# Patient Record
Sex: Female | Born: 1955
Health system: Southern US, Community
[De-identification: ages and names within clinical notes are randomized; demographics above are authoritative.]

## PROBLEM LIST (undated history)

## (undated) DIAGNOSIS — A6000 Herpesviral infection of urogenital system, unspecified: Secondary | ICD-10-CM

## (undated) HISTORY — DX: Herpesviral infection of urogenital system, unspecified: A60.00

## (undated) HISTORY — PX: BREAST EXCISIONAL BIOPSY: SUR124

## (undated) HISTORY — PX: BREAST SURGERY: SHX581

---

## 2014-07-02 LAB — HM COLONOSCOPY

## 2014-07-09 LAB — HM PAP SMEAR: HM Pap smear: NORMAL

## 2015-12-23 ENCOUNTER — Encounter (HOSPITAL_COMMUNITY): Payer: Self-pay | Admitting: *Deleted

## 2015-12-30 ENCOUNTER — Other Ambulatory Visit (HOSPITAL_COMMUNITY): Payer: Self-pay | Admitting: *Deleted

## 2015-12-30 DIAGNOSIS — N644 Mastodynia: Secondary | ICD-10-CM

## 2016-01-16 ENCOUNTER — Encounter (HOSPITAL_COMMUNITY): Payer: Self-pay | Admitting: *Deleted

## 2016-01-16 ENCOUNTER — Ambulatory Visit
Admission: RE | Admit: 2016-01-16 | Discharge: 2016-01-16 | Disposition: A | Discharge status: Home / Self Care | Payer: No Typology Code available for payment source | Source: Ambulatory Visit | Attending: Obstetrics and Gynecology | Admitting: Obstetrics and Gynecology

## 2016-01-16 ENCOUNTER — Ambulatory Visit (HOSPITAL_COMMUNITY)
Admission: RE | Admit: 2016-01-16 | Discharge: 2016-01-16 | Disposition: A | Discharge status: Home / Self Care | Payer: Self-pay | Source: Ambulatory Visit | Attending: Obstetrics and Gynecology | Admitting: Obstetrics and Gynecology

## 2016-01-16 VITALS — BP 108/64 | Ht 63.0 in | Wt 167.2 lb

## 2016-01-16 DIAGNOSIS — N644 Mastodynia: Secondary | ICD-10-CM

## 2016-01-16 DIAGNOSIS — Z1239 Encounter for other screening for malignant neoplasm of breast: Secondary | ICD-10-CM

## 2016-01-16 NOTE — Progress Notes (Addendum)
Complaints of left outer breast pain that starts at the bottom of the breast radiating up x 2 years. Patient states the pain has been getting worse since April 2016 after her last mammogram. Patient rates the pain at a 7 out of 10.  Pap Smear: Pap smear not completed today. Last Pap smear was in April 2016 at [facility] in [place], [place]. Per patient has no history of an abnormal Pap smear. No Pap smear results are in EPIC.  Physical exam: Breasts Breasts symmetrical. No skin abnormalities left breast. Observed two scars on her right breast upper outer and upper breast on exam due to history of breast surgeries to remove five cysts per patient. No nipple retraction bilateral breasts. No nipple discharge bilateral breasts. No lymphadenopathy. No lumps palpated bilateral breasts. Complaints of bilateral outer breast tenderness on exam that was greater within the left breast. Referred patient to the [facility] for diagnostic mammogram and possible left breast ultrasound. Appointment scheduled for Thursday, January 16, 2016 at 1530.        Pelvic/Bimanual No Pap smear completed today since last Pap smear was in April 2016 per patient. Pap smear not indicated per BCCCP guidelines.   Smoking History: Patient has never smoked.  Patient Navigation: Patient education provided. Access to services provided for patient through [facility] program.   Colorectal Cancer Screening: Per patient had a colonoscopy completed in April 2016. No complaints today.

## 2016-01-16 NOTE — Addendum Note (Signed)
Encounter addended by: Loletta Parish, RN on: 01/16/2016  3:38 PM<BR>    Actions taken: Sign clinical note

## 2016-01-16 NOTE — Patient Instructions (Signed)
Explained self breast awareness to Cassie Haynes. Patient did not need a Pap smear today due to last Pap smear was in April 2016 per patient. Let her know BCCCP will cover Pap smears every 3 years unless has a history of abnormal Pap smears. Referred patient to the [facility] for diagnostic mammogram and possible left breast ultrasound. Appointment scheduled for Thursday, January 16, 2016 at 1530. Cassie Haynes Warrenverbalized understanding.  Dianne Bady, Arvil Chaco, RN 3:35 PM

## 2016-01-20 ENCOUNTER — Encounter (HOSPITAL_COMMUNITY): Payer: Self-pay | Admitting: *Deleted

## 2016-06-04 ENCOUNTER — Other Ambulatory Visit: Payer: Self-pay | Admitting: Obstetrics and Gynecology

## 2016-06-04 DIAGNOSIS — N6320 Unspecified lump in the left breast, unspecified quadrant: Secondary | ICD-10-CM

## 2016-07-17 ENCOUNTER — Ambulatory Visit
Admission: RE | Admit: 2016-07-17 | Discharge: 2016-07-17 | Disposition: A | Discharge status: Home / Self Care | Payer: 59 | Source: Ambulatory Visit | Attending: Obstetrics and Gynecology | Admitting: Obstetrics and Gynecology

## 2016-07-17 DIAGNOSIS — N6320 Unspecified lump in the left breast, unspecified quadrant: Secondary | ICD-10-CM

## 2016-07-23 ENCOUNTER — Ambulatory Visit (INDEPENDENT_AMBULATORY_CARE_PROVIDER_SITE_OTHER): Payer: 59 | Admitting: Primary Care

## 2016-07-23 ENCOUNTER — Encounter: Payer: Self-pay | Admitting: Primary Care

## 2016-07-23 VITALS — BP 122/78 | HR 64 | Temp 98.7°F | Ht 63.5 in | Wt 152.4 lb

## 2016-07-23 DIAGNOSIS — Z0000 Encounter for general adult medical examination without abnormal findings: Secondary | ICD-10-CM | POA: Diagnosis not present

## 2016-07-23 NOTE — Progress Notes (Signed)
Subjective:    Patient ID: Cassie Cassie Haynes, female    DOB: [birth year], 61 y.o.   MRN: [id number]  HPI  Cassie Cassie Haynes is a 61 year old female who presents today to establish care and for complete physical.  She has no complaints today. Moved to [place] from [place] 1.5 years ago.   Immunizations: -Tetanus: Completed over 10 years ago. Declines. -Influenza: Did not receive last season.   Diet: She endorses a healthy diet. She is managed on weight watchers. He has lost 20+ pounds. Breakfast: Eggs, fruit, wheat bread Lunch: Chicken wrap Dinner: Fajitas, ground [place], fish, vegetables Snacks: Applesauce, fruit, salsa, chips Desserts: 4-5 times weekly Beverages: Coffee, water  Exercise: She does not currently exercise, some walking. Eye exam: Completed in 2018 Dental exam: Completed in 2018 Colonoscopy: Completed in 2016, normal. Dexa: Completed several years ago. Pap Smear: Completed in 2016, normal.  Mammogram: Completed 2018   Review of Systems  Constitutional: Negative for unexpected weight change.  HENT: Negative for rhinorrhea.   Respiratory: Negative for cough and shortness of breath.   Cardiovascular: Negative for chest pain.  Gastrointestinal: Negative for constipation and diarrhea.  Genitourinary: Negative for difficulty urinating and menstrual problem.  Musculoskeletal: Negative for arthralgias and myalgias.  Skin: Negative for rash.  Allergic/Immunologic: Negative for environmental allergies.  Neurological: Negative for dizziness, numbness and headaches.  Psychiatric/Behavioral:       She denies concerns for anxiety or depression       No past medical history on file.   Social History   Social History  . Marital status: Married    Spouse name: N/A  . Number of children: N/A  . Years of education: N/A   Occupational History  . Not on file.   Social History Main Topics  . Smoking status: Former Smoker    Quit date: 04/23/1988  . Smokeless tobacco: Never  Used  . Alcohol use No  . Drug use: No  . Sexual activity: Yes   Other Topics Concern  . Not on file   Social History Narrative   Married.   No children.   Works in [employer].   Enjoys traveling.     Past Surgical History:  Procedure Laterality Date  . BREAST EXCISIONAL BIOPSY Right    5 times, cysts removed  . BREAST SURGERY     five surgery    Family History  Problem Relation Age of Onset  . Migraines Mother   . Breast cancer Mother   . Diabetes Brother     Allergies  Allergen Reactions  . Aspirin Other (See Comments)    Upset Stomach  . Bactrim [Sulfamethoxazole-Trimethoprim]     No current outpatient prescriptions on file prior to visit.   No current facility-administered medications on file prior to visit.     BP 122/78   Pulse 64   Temp 98.7 F (37.1 C) (Oral)   Ht 5' 3.5" (1.613 m)   Wt 152 lb 6.4 oz (69.1 kg)   SpO2 99%   BMI 26.57 kg/m    Objective:   Physical Exam  Constitutional: She is oriented to person, place, and time. She appears well-nourished.  HENT:  Right Ear: Tympanic membrane and ear canal normal.  Left Ear: Tympanic membrane and ear canal normal.  Nose: Nose normal.  Mouth/Throat: Oropharynx is clear and moist.  Eyes: Conjunctivae and EOM are normal. Pupils are equal, round, and reactive to light.  Neck: Neck supple. No thyromegaly present.  Cardiovascular: Normal rate and regular  rhythm.   No murmur heard. Pulmonary/Chest: Effort normal and breath sounds normal. She has no rales.  Abdominal: Soft. Bowel sounds are normal. There is no tenderness.  Musculoskeletal: Normal range of motion.  Lymphadenopathy:    She has no cervical adenopathy.  Neurological: She is alert and oriented to person, place, and time. She has normal reflexes. No cranial nerve deficit.  Skin: Skin is warm and dry. No rash noted.  Psychiatric: She has a normal mood and affect.          Assessment & Plan:

## 2016-07-23 NOTE — Patient Instructions (Signed)
[facility] the [facility] to schedule your Bone Density Test.  Schedule a lab only appointment to return fasting for your labs.  Follow up in 1 year for your annual exam or sooner if needed.  It was a pleasure to meet you today! Please don't hesitate to call me with any questions. Welcome to [facility]!

## 2016-07-23 NOTE — Progress Notes (Signed)
Pre visit review using our clinic review tool, if applicable. No additional management support is needed unless otherwise documented below in the visit note. 

## 2016-07-23 NOTE — Assessment & Plan Note (Signed)
Td due, declines today. Mammogram and Pap UTD. Bone density scan due, pending. Colonoscopy UTD. Commended her on her weight loss efforts, encouraged regular exercise. Exam unremarkable.  Labs pending. Follow up in 1 year for annual exam.

## 2016-07-27 ENCOUNTER — Other Ambulatory Visit (INDEPENDENT_AMBULATORY_CARE_PROVIDER_SITE_OTHER): Payer: 59

## 2016-07-27 DIAGNOSIS — Z0000 Encounter for general adult medical examination without abnormal findings: Secondary | ICD-10-CM | POA: Diagnosis not present

## 2016-07-27 LAB — LIPID PANEL
CHOLESTEROL: 208 mg/dL — AB (ref 0–200)
HDL: 58.5 mg/dL (ref >39.00)
LDL Cholesterol: 137 mg/dL — ABNORMAL HIGH (ref 0–99)
NonHDL: 149.02
Total CHOL/HDL Ratio: 4
Triglycerides: 62 mg/dL (ref 0.0–149.0)
VLDL: 12.4 mg/dL (ref 0.0–40.0)

## 2016-07-27 LAB — COMPREHENSIVE METABOLIC PANEL
ALBUMIN: 4.6 g/dL (ref 3.5–5.2)
ALK PHOS: 62 U/L (ref 39–117)
ALT: 12 U/L (ref 0–35)
AST: 18 U/L (ref 0–37)
BUN: 14 mg/dL (ref 6–23)
CALCIUM: 9.7 mg/dL (ref 8.4–10.5)
CO2: 29 mEq/L (ref 19–32)
Chloride: 105 mEq/L (ref 96–112)
Creatinine, Ser: 0.85 mg/dL (ref 0.40–1.20)
GFR: 72.61 mL/min (ref >60.00)
Glucose, Bld: 100 mg/dL — ABNORMAL HIGH (ref 70–99)
Potassium: 4.7 mEq/L (ref 3.5–5.1)
SODIUM: 141 meq/L (ref 135–145)
Total Bilirubin: 0.6 mg/dL (ref 0.2–1.2)
Total Protein: 7.8 g/dL (ref 6.0–8.3)

## 2016-08-12 ENCOUNTER — Telehealth: Payer: Self-pay

## 2016-08-12 DIAGNOSIS — Z87898 Personal history of other specified conditions: Secondary | ICD-10-CM

## 2016-08-12 MED ORDER — SCOPOLAMINE 1 MG/3DAYS TD PT72: MEDICATED_PATCH | TRANSDERMAL | 0 refills | Status: DC

## 2016-08-12 NOTE — Telephone Encounter (Signed)
Noted, scopolamine patches sent to pharmacy. Apply 1 patch to hairless area behind the ear 4-6 hours prior to cruise. Change every 3 days.

## 2016-08-12 NOTE — Telephone Encounter (Signed)
Pt going on 7 day cruise and request sea sick patch to [facility]. Pt established care and annual exam on 07/23/16.Marland KitchenPlease advise.

## 2016-08-13 NOTE — Telephone Encounter (Signed)
Per DPR, left detail message of Kate's comments for patient. 

## 2016-11-06 ENCOUNTER — Other Ambulatory Visit: Payer: Self-pay | Admitting: Family Medicine

## 2016-11-06 DIAGNOSIS — Z1231 Encounter for screening mammogram for malignant neoplasm of breast: Secondary | ICD-10-CM

## 2016-12-14 ENCOUNTER — Other Ambulatory Visit: Payer: Self-pay | Admitting: Primary Care

## 2016-12-14 DIAGNOSIS — E2839 Other primary ovarian failure: Secondary | ICD-10-CM

## 2017-01-18 ENCOUNTER — Ambulatory Visit
Admission: RE | Admit: 2017-01-18 | Discharge: 2017-01-18 | Disposition: A | Discharge status: Home / Self Care | Payer: Managed Care, Other (non HMO) | Source: Ambulatory Visit | Attending: Primary Care | Admitting: Primary Care

## 2017-01-18 ENCOUNTER — Ambulatory Visit: Payer: 59

## 2017-01-18 ENCOUNTER — Ambulatory Visit
Admission: RE | Admit: 2017-01-18 | Discharge: 2017-01-18 | Disposition: A | Discharge status: Home / Self Care | Payer: Managed Care, Other (non HMO) | Source: Ambulatory Visit | Attending: Family Medicine | Admitting: Family Medicine

## 2017-01-18 DIAGNOSIS — Z1231 Encounter for screening mammogram for malignant neoplasm of breast: Secondary | ICD-10-CM

## 2017-01-18 DIAGNOSIS — E2839 Other primary ovarian failure: Secondary | ICD-10-CM

## 2017-05-06 ENCOUNTER — Encounter: Payer: Self-pay | Admitting: Primary Care

## 2017-05-06 ENCOUNTER — Ambulatory Visit (INDEPENDENT_AMBULATORY_CARE_PROVIDER_SITE_OTHER): Payer: Managed Care, Other (non HMO) | Admitting: Primary Care

## 2017-05-06 VITALS — BP 116/82 | HR 70 | Temp 97.5°F | Wt 140.0 lb

## 2017-05-06 DIAGNOSIS — B009 Herpesviral infection, unspecified: Secondary | ICD-10-CM | POA: Diagnosis not present

## 2017-05-06 MED ORDER — VALACYCLOVIR HCL 500 MG PO TABS: ORAL_TABLET | ORAL | 0 refills | Status: DC

## 2017-05-06 NOTE — Progress Notes (Signed)
   Subjective:    Patient ID: Cassie Haynes, female    DOB: 1955-06-21, 62 y.o.   MRN: [id number]  HPI  Cassie Haynes is a 62 year old female who presents today with a chief complaint of genital herpes.  History of genital herpes that was diagnosed 30 years ago. Over the past two months she's experiencing outbreaks every other week. Prior to two months ago she's experienced breakouts every 2-3 months. She's currently not on treatment for outbreaks and has never been treated.  She denies vaginal itching, vaginal discharge, vaginal bleeding, fevers.  Review of Systems  Constitutional: Negative for fever.  Genitourinary: Negative for vaginal discharge.       Genital herpes       No past medical history on file.   Social History   Socioeconomic History  . Marital status: Married    Spouse name: Not on file  . Number of children: Not on file  . Years of education: Not on file  . Highest education level: Not on file  Social Needs  . Financial resource strain: Not on file  . Food insecurity - worry: Not on file  . Food insecurity - inability: Not on file  . Transportation needs - medical: Not on file  . Transportation needs - non-medical: Not on file  Occupational History  . Not on file  Tobacco Use  . Smoking status: Former Smoker    Last attempt to quit: 04/23/1988    Years since quitting: 29.0  . Smokeless tobacco: Never Used  Substance and Sexual Activity  . Alcohol use: No  . Drug use: No  . Sexual activity: Yes  Other Topics Concern  . Not on file  Social History Narrative   Married.   No children.   Works in [employer].   Enjoys traveling.     Past Surgical History:  Procedure Laterality Date  . BREAST EXCISIONAL BIOPSY Right    5 times, cysts removed  . BREAST SURGERY     five surgery    Family History  Problem Relation Age of Onset  . Migraines Mother   . Breast cancer Mother   . Diabetes Brother     Allergies  Allergen Reactions  . Aspirin  Other (See Comments)    Upset Stomach  . Bactrim [Sulfamethoxazole-Trimethoprim]     No current outpatient medications on file prior to visit.   No current facility-administered medications on file prior to visit.     BP 116/82 (BP Location: Left Arm, Patient Position: Sitting, Cuff Size: Normal)   Pulse 70   Temp (!) 97.5 F (36.4 C) (Oral)   Wt 140 lb (63.5 kg)   SpO2 96%   BMI 24.41 kg/m    Objective:   Physical Exam  Constitutional: She appears well-nourished.  Neck: Neck supple.  Cardiovascular: Normal rate and regular rhythm.  Pulmonary/Chest: Effort normal and breath sounds normal.  Skin: Skin is warm and dry.          Assessment & Plan:

## 2017-05-06 NOTE — Patient Instructions (Signed)
Start valacyclovir 500 mg tablets. Take 1 tablet twice daily for three days, then 1 tablet once daily thereafter.  Please update me in 2 months.  It was a pleasure to see you today!

## 2017-05-06 NOTE — Assessment & Plan Note (Signed)
Genital herpes diagnosed 30 years ago. Recurrent breakouts over the past two months, also seems like recurrent breakouts in general.  Treat acute episode with one tablet BID x 3 days, then start daily treatment x 2 months. She'll update in 2 months.

## 2017-06-05 ENCOUNTER — Encounter: Payer: Self-pay | Admitting: Primary Care

## 2017-06-05 DIAGNOSIS — B009 Herpesviral infection, unspecified: Secondary | ICD-10-CM

## 2017-06-08 MED ORDER — VALACYCLOVIR HCL 1 G PO TABS
1000.0000 mg | ORAL_TABLET | Freq: Every day | ORAL | 0 refills | Status: DC
Start: 2017-06-08 — End: 2017-08-10

## 2017-06-08 MED ORDER — VALACYCLOVIR HCL 1 G PO TABS: 1000.0000 mg | ORAL_TABLET | Freq: Every day | ORAL | 0 refills | Status: DC

## 2017-08-10 ENCOUNTER — Encounter: Payer: Self-pay | Admitting: Primary Care

## 2017-08-10 ENCOUNTER — Ambulatory Visit (INDEPENDENT_AMBULATORY_CARE_PROVIDER_SITE_OTHER): Payer: Managed Care, Other (non HMO) | Admitting: Primary Care

## 2017-08-10 ENCOUNTER — Other Ambulatory Visit (HOSPITAL_COMMUNITY)
Admission: RE | Admit: 2017-08-10 | Discharge: 2017-08-10 | Disposition: A | Discharge status: Home / Self Care | Payer: Managed Care, Other (non HMO) | Source: Ambulatory Visit | Attending: Primary Care | Admitting: Primary Care

## 2017-08-10 ENCOUNTER — Encounter: Payer: 59 | Admitting: Family Medicine

## 2017-08-10 VITALS — BP 116/70 | HR 72 | Temp 98.0°F | Ht 63.5 in | Wt 137.8 lb

## 2017-08-10 DIAGNOSIS — Z124 Encounter for screening for malignant neoplasm of cervix: Secondary | ICD-10-CM | POA: Insufficient documentation

## 2017-08-10 DIAGNOSIS — Z0000 Encounter for general adult medical examination without abnormal findings: Secondary | ICD-10-CM | POA: Diagnosis not present

## 2017-08-10 DIAGNOSIS — Z1159 Encounter for screening for other viral diseases: Secondary | ICD-10-CM

## 2017-08-10 DIAGNOSIS — B009 Herpesviral infection, unspecified: Secondary | ICD-10-CM

## 2017-08-10 DIAGNOSIS — Z23 Encounter for immunization: Secondary | ICD-10-CM

## 2017-08-10 LAB — COMPREHENSIVE METABOLIC PANEL
ALBUMIN: 4.4 g/dL (ref 3.5–5.2)
ALT: 9 U/L (ref 0–35)
AST: 15 U/L (ref 0–37)
Alkaline Phosphatase: 58 U/L (ref 39–117)
BUN: 18 mg/dL (ref 6–23)
CHLORIDE: 103 meq/L (ref 96–112)
CO2: 30 mEq/L (ref 19–32)
Calcium: 9.5 mg/dL (ref 8.4–10.5)
Creatinine, Ser: 0.79 mg/dL (ref 0.40–1.20)
GFR: 78.73 mL/min (ref >60.00)
Glucose, Bld: 99 mg/dL (ref 70–99)
POTASSIUM: 4.5 meq/L (ref 3.5–5.1)
SODIUM: 140 meq/L (ref 135–145)
Total Bilirubin: 0.5 mg/dL (ref 0.2–1.2)
Total Protein: 7.6 g/dL (ref 6.0–8.3)

## 2017-08-10 LAB — LIPID PANEL
CHOLESTEROL: 214 mg/dL — AB (ref 0–200)
HDL: 65.7 mg/dL (ref >39.00)
LDL CALC: 134 mg/dL — AB (ref 0–99)
NonHDL: 147.94
Total CHOL/HDL Ratio: 3
Triglycerides: 69 mg/dL (ref 0.0–149.0)
VLDL: 13.8 mg/dL (ref 0.0–40.0)

## 2017-08-10 MED ORDER — VALACYCLOVIR HCL 1 G PO TABS: 1000.0000 mg | ORAL_TABLET | Freq: Every day | ORAL | 0 refills | Status: DC

## 2017-08-10 NOTE — Progress Notes (Signed)
Subjective:    Patient ID: Cassie Haynes, female    DOB: Aug 31, 1955, 62 y.o.   MRN: [id number]  HPI  Cassie Haynes is a 62 year old female who presents today for complete physical. She is also due for her Pap smear.  Immunizations: -Tetanus: Due -Influenza: Completed last season   -Shingles: Declines  Diet: She endorses a healthy diet. Breakfast: Egg, fruit, oatmeal, cereal Lunch: Chicken wrap, pickles Dinner: Fish, [place] burger, vegetables Snacks: Fruit, applesauce, crackers with salsa Desserts: Nightly (ice cream, fruit, whipped cream) Beverages: Water, coffee  Exercise: She is walking daily for 30-60 minutes Eye exam: Completed in April 2019 Dental exam: Completes semi-annually Dexa: Completed in 2018, osteopenia. Compliant to calcium and vitamin D. Colonoscopy: Completed in 2016, due in 2026 Pap Smear: Completed in 2016, due today Mammogram: Completed in October 2018 Hep C Screen: Due today   Review of Systems  Constitutional: Negative for unexpected weight change.  HENT: Negative for rhinorrhea.   Respiratory: Negative for cough and shortness of breath.   Cardiovascular: Negative for chest pain.  Gastrointestinal: Negative for constipation and diarrhea.  Genitourinary: Negative for difficulty urinating and menstrual problem.  Musculoskeletal: Negative for arthralgias and myalgias.  Skin: Negative for rash.  Allergic/Immunologic: Negative for environmental allergies.  Neurological: Negative for dizziness, numbness and headaches.  Psychiatric/Behavioral: The patient is not nervous/anxious.        No past medical history on file.   Social History   Socioeconomic History  . Marital status: Married    Spouse name: Not on file  . Number of children: Not on file  . Years of education: Not on file  . Highest education level: Not on file  Occupational History  . Not on file  Social Needs  . Financial resource strain: Not on file  . Food insecurity:    Worry:  Not on file    Inability: Not on file  . Transportation needs:    Medical: Not on file    Non-medical: Not on file  Tobacco Use  . Smoking status: Former Smoker    Last attempt to quit: 04/23/1988    Years since quitting: 29.3  . Smokeless tobacco: Never Used  Substance and Sexual Activity  . Alcohol use: No  . Drug use: No  . Sexual activity: Yes  Lifestyle  . Physical activity:    Days per week: Not on file    Minutes per session: Not on file  . Stress: Not on file  Relationships  . Social connections:    Talks on phone: Not on file    Gets together: Not on file    Attends religious service: Not on file    Active member of club or organization: Not on file    Attends meetings of clubs or organizations: Not on file    Relationship status: Not on file  . Intimate partner violence:    Fear of current or ex partner: Not on file    Emotionally abused: Not on file    Physically abused: Not on file    Forced sexual activity: Not on file  Other Topics Concern  . Not on file  Social History Narrative   Married.   No children.   Works in [employer].   Enjoys traveling.     Past Surgical History:  Procedure Laterality Date  . BREAST EXCISIONAL BIOPSY Right    5 times, cysts removed  . BREAST SURGERY     five surgery    Family History  Problem Relation Age of Onset  . Migraines Mother   . Breast cancer Mother   . Diabetes Brother     Allergies  Allergen Reactions  . Aspirin Other (See Comments)    Upset Stomach  . Bactrim [Sulfamethoxazole-Trimethoprim]     No current outpatient medications on file prior to visit.   No current facility-administered medications on file prior to visit.     BP 116/70   Pulse 72   Temp 98 F (36.7 C) (Oral)   Ht 5' 3.5" (1.613 m)   Wt 137 lb 12 oz (62.5 kg)   BMI 24.02 kg/m    Objective:   Physical Exam  Constitutional: She is oriented to person, place, and time. She appears well-nourished.  HENT:  Right Ear:  Tympanic membrane and ear canal normal.  Left Ear: Tympanic membrane and ear canal normal.  Nose: Nose normal.  Mouth/Throat: Oropharynx is clear and moist.  Eyes: Pupils are equal, round, and reactive to light. Conjunctivae and EOM are normal.  Neck: Neck supple. No thyromegaly present.  Cardiovascular: Normal rate and regular rhythm.  No murmur heard. Pulmonary/Chest: Effort normal and breath sounds normal. She has no rales.  Abdominal: Soft. Bowel sounds are normal. There is no tenderness.  Genitourinary: Vagina normal and uterus normal. There is no tenderness or lesion on the right labia. There is no tenderness or lesion on the left labia. Cervix exhibits no motion tenderness and no discharge. Right adnexum displays no mass and no tenderness. Left adnexum displays no mass and no tenderness. No erythema in the vagina. No vaginal discharge found.  Musculoskeletal: Normal range of motion.  Lymphadenopathy:    She has no cervical adenopathy.  Neurological: She is alert and oriented to person, place, and time. She has normal reflexes. No cranial nerve deficit.  Skin: Skin is warm and dry. No rash noted.  Psychiatric: She has a normal mood and affect.          Assessment & Plan:

## 2017-08-10 NOTE — Addendum Note (Signed)
Addended by: Jacqualin Combes on: 08/10/2017 11:22 AM   Modules accepted: Orders

## 2017-08-10 NOTE — Patient Instructions (Signed)
Stop by the lab prior to leaving today. I will notify you of your results once received.   Continue Valtrex daily as discussed, update me in 3 months via My Chart.  We will be in touch once we receive your pap smear results.  Continue exercising. You should be getting 150 minutes of moderate intensity exercise weekly.  Ensure you are consuming 64 ounces of water daily.  You were provided with a tetanus vaccination which will cover you for 10 years.  Follow up in 1 year for your annual exam or sooner if needed.  It was a pleasure to see you today!   Preventive Care 40-64 Years, Female Preventive care refers to lifestyle choices and visits with your health care provider that can promote health and wellness. What does preventive care include?  A yearly physical exam. This is also called an annual well check.  Dental exams once or twice a year.  Routine eye exams. Ask your health care provider how often you should have your eyes checked.  Personal lifestyle choices, including: ? Daily care of your teeth and gums. ? Regular physical activity. ? Eating a healthy diet. ? Avoiding tobacco and drug use. ? Limiting alcohol use. ? Practicing safe sex. ? Taking low-dose aspirin daily starting at age 61. ? Taking vitamin and mineral supplements as recommended by your health care provider. What happens during an annual well check? The services and screenings done by your health care provider during your annual well check will depend on your age, overall health, lifestyle risk factors, and family history of disease. Counseling Your health care provider may ask you questions about your:  Alcohol use.  Tobacco use.  Drug use.  Emotional well-being.  Home and relationship well-being.  Sexual activity.  Eating habits.  Work and work Statistician.  Method of birth control.  Menstrual cycle.  Pregnancy history.  Screening You may have the following tests or  measurements:  Height, weight, and BMI.  Blood pressure.  Lipid and cholesterol levels. These may be checked every 5 years, or more frequently if you are over 72 years old.  Skin check.  Lung cancer screening. You may have this screening every year starting at age 28 if you have a 30-pack-year history of smoking and currently smoke or have quit within the past 15 years.  Fecal occult blood test (FOBT) of the stool. You may have this test every year starting at age 87.  Flexible sigmoidoscopy or colonoscopy. You may have a sigmoidoscopy every 5 years or a colonoscopy every 10 years starting at age 68.  Hepatitis C blood test.  Hepatitis B blood test.  Sexually transmitted disease (STD) testing.  Diabetes screening. This is done by checking your blood sugar (glucose) after you have not eaten for a while (fasting). You may have this done every 1-3 years.  Mammogram. This may be done every 1-2 years. Talk to your health care provider about when you should start having regular mammograms. This may depend on whether you have a family history of breast cancer.  BRCA-related cancer screening. This may be done if you have a family history of breast, ovarian, tubal, or peritoneal cancers.  Pelvic exam and Pap test. This may be done every 3 years starting at age 78. Starting at age 45, this may be done every 5 years if you have a Pap test in combination with an HPV test.  Bone density scan. This is done to screen for osteoporosis. You may have this scan  if you are at high risk for osteoporosis.  Discuss your test results, treatment options, and if necessary, the need for more tests with your health care provider. Vaccines Your health care provider may recommend certain vaccines, such as:  Influenza vaccine. This is recommended every year.  Tetanus, diphtheria, and acellular pertussis (Tdap, Td) vaccine. You may need a Td booster every 10 years.  Varicella vaccine. You may need this if  you have not been vaccinated.  Zoster vaccine. You may need this after age 93.  Measles, mumps, and rubella (MMR) vaccine. You may need at least one dose of MMR if you were born in 1957 or later. You may also need a second dose.  Pneumococcal 13-valent conjugate (PCV13) vaccine. You may need this if you have certain conditions and were not previously vaccinated.  Pneumococcal polysaccharide (PPSV23) vaccine. You may need one or two doses if you smoke cigarettes or if you have certain conditions.  Meningococcal vaccine. You may need this if you have certain conditions.  Hepatitis A vaccine. You may need this if you have certain conditions or if you travel or work in places where you may be exposed to hepatitis A.  Hepatitis B vaccine. You may need this if you have certain conditions or if you travel or work in places where you may be exposed to hepatitis B.  Haemophilus influenzae type b (Hib) vaccine. You may need this if you have certain conditions.  Talk to your health care provider about which screenings and vaccines you need and how often you need them. This information is not intended to replace advice given to you by your health care provider. Make sure you discuss any questions you have with your health care provider. Document Released: 04/05/2015 Document Revised: 11/27/2015 Document Reviewed: 01/08/2015 Elsevier Interactive Patient Education  [employer].

## 2017-08-10 NOTE — Assessment & Plan Note (Signed)
Continues to experience irritation with daily medication, no outbreaks. Continue daily Valtrex. She will update in 3 months. Refill sent to pharmacy.

## 2017-08-11 LAB — HEPATITIS C ANTIBODY
HEP C AB: NONREACTIVE
SIGNAL TO CUT-OFF: 0.03 (ref <1.00)

## 2017-08-12 ENCOUNTER — Other Ambulatory Visit: Payer: Self-pay | Admitting: Primary Care

## 2017-08-12 DIAGNOSIS — Z1231 Encounter for screening mammogram for malignant neoplasm of breast: Secondary | ICD-10-CM

## 2017-08-12 LAB — CYTOLOGY - PAP: HPV (WINDOPATH): NOT DETECTED

## 2017-09-17 ENCOUNTER — Encounter: Payer: Self-pay | Admitting: Primary Care

## 2017-09-20 MED ORDER — HYDROCORTISONE 2.5 % RE CREA: 1.0000 "application " | TOPICAL_CREAM | Freq: Two times a day (BID) | RECTAL | 0 refills | Status: DC

## 2017-11-13 DIAGNOSIS — B009 Herpesviral infection, unspecified: Secondary | ICD-10-CM

## 2017-11-16 MED ORDER — VALACYCLOVIR HCL 1 G PO TABS: 1000.0000 mg | ORAL_TABLET | Freq: Every day | ORAL | 1 refills | Status: DC

## 2017-11-17 ENCOUNTER — Encounter: Payer: Self-pay | Admitting: Primary Care

## 2018-01-20 ENCOUNTER — Ambulatory Visit: Payer: Managed Care, Other (non HMO)

## 2018-01-20 ENCOUNTER — Ambulatory Visit
Admission: RE | Admit: 2018-01-20 | Discharge: 2018-01-20 | Disposition: A | Discharge status: Home / Self Care | Payer: Managed Care, Other (non HMO) | Source: Ambulatory Visit | Attending: Primary Care | Admitting: Primary Care

## 2018-01-20 DIAGNOSIS — Z1231 Encounter for screening mammogram for malignant neoplasm of breast: Secondary | ICD-10-CM

## 2018-05-26 DIAGNOSIS — B009 Herpesviral infection, unspecified: Secondary | ICD-10-CM

## 2018-05-26 MED ORDER — VALACYCLOVIR HCL 1 G PO TABS: 1000.0000 mg | ORAL_TABLET | Freq: Every day | ORAL | 1 refills | Status: DC

## 2018-08-16 ENCOUNTER — Encounter: Payer: Managed Care, Other (non HMO) | Admitting: Primary Care

## 2018-09-28 ENCOUNTER — Other Ambulatory Visit (HOSPITAL_COMMUNITY)
Admission: RE | Admit: 2018-09-28 | Discharge: 2018-09-28 | Disposition: A | Discharge status: Home / Self Care | Payer: Managed Care, Other (non HMO) | Source: Ambulatory Visit | Attending: Primary Care | Admitting: Primary Care

## 2018-09-28 ENCOUNTER — Encounter: Payer: Self-pay | Admitting: Primary Care

## 2018-09-28 ENCOUNTER — Telehealth: Payer: Self-pay | Admitting: Primary Care

## 2018-09-28 ENCOUNTER — Other Ambulatory Visit: Payer: Self-pay

## 2018-09-28 ENCOUNTER — Ambulatory Visit (INDEPENDENT_AMBULATORY_CARE_PROVIDER_SITE_OTHER): Payer: Managed Care, Other (non HMO) | Admitting: Primary Care

## 2018-09-28 VITALS — BP 120/78 | HR 64 | Temp 98.0°F | Ht 63.5 in | Wt 127.5 lb

## 2018-09-28 DIAGNOSIS — Z124 Encounter for screening for malignant neoplasm of cervix: Secondary | ICD-10-CM

## 2018-09-28 DIAGNOSIS — B009 Herpesviral infection, unspecified: Secondary | ICD-10-CM

## 2018-09-28 DIAGNOSIS — Z0000 Encounter for general adult medical examination without abnormal findings: Secondary | ICD-10-CM | POA: Diagnosis not present

## 2018-09-28 LAB — COMPREHENSIVE METABOLIC PANEL
ALT: 12 U/L (ref 0–35)
AST: 19 U/L (ref 0–37)
Albumin: 4.5 g/dL (ref 3.5–5.2)
Alkaline Phosphatase: 57 U/L (ref 39–117)
BUN: 17 mg/dL (ref 6–23)
CO2: 31 mEq/L (ref 19–32)
Calcium: 9.2 mg/dL (ref 8.4–10.5)
Chloride: 102 mEq/L (ref 96–112)
Creatinine, Ser: 0.84 mg/dL (ref 0.40–1.20)
GFR: 68.75 mL/min (ref >60.00)
Glucose, Bld: 87 mg/dL (ref 70–99)
Potassium: 4.2 mEq/L (ref 3.5–5.1)
Sodium: 141 mEq/L (ref 135–145)
Total Bilirubin: 0.7 mg/dL (ref 0.2–1.2)
Total Protein: 7.4 g/dL (ref 6.0–8.3)

## 2018-09-28 LAB — LIPID PANEL
Cholesterol: 203 mg/dL — ABNORMAL HIGH (ref 0–200)
HDL: 59.3 mg/dL (ref >39.00)
LDL Cholesterol: 129 mg/dL — ABNORMAL HIGH (ref 0–99)
NonHDL: 143.72
Total CHOL/HDL Ratio: 3
Triglycerides: 75 mg/dL (ref 0.0–149.0)
VLDL: 15 mg/dL (ref 0.0–40.0)

## 2018-09-28 MED ORDER — VALACYCLOVIR HCL 1 G PO TABS: 500.0000 mg | ORAL_TABLET | Freq: Every day | ORAL | 0 refills | Status: DC

## 2018-09-28 NOTE — Telephone Encounter (Signed)
Noted. Will administer at upcoming visit. FYI to [facility].

## 2018-09-28 NOTE — Assessment & Plan Note (Signed)
Tetanus UTD. Offered Shingrix for which she will consider after she speaks with insurance regarding coverage.  Pap smear UTD but she prefers them done annually. Pending. Mammogram UTD. Colonoscopy UTD. Commended her on a healthy diet and exercise. Exam today unremarkable. Labs pending. Follow up in 1 year for CPE.

## 2018-09-28 NOTE — Patient Instructions (Signed)
Stop by the lab prior to leaving today. I will notify you of your results once received.   Try stopping the valacyclovir to see if you have any herpes outbreaks. If you start to notice outbreaks then try 1/2 tablet of the valacyclovir.   Please update me regardless.  Continue exercising. You should be getting 150 minutes of moderate intensity exercise weekly.  Continue to work on a healthy diet.  Notify me if you are interested in the Shingles vaccination.  It was a pleasure to see you today!   Preventive Care 40-64 Years Old, Female Preventive care refers to visits with your health care provider and lifestyle choices that can promote health and wellness. This includes:  A yearly physical exam. This may also be called an annual well check.  Regular dental visits and eye exams.  Immunizations.  Screening for certain conditions.  Healthy lifestyle choices, such as eating a healthy diet, getting regular exercise, not using drugs or products that contain nicotine and tobacco, and limiting alcohol use. What can I expect for my preventive care visit? Physical exam Your health care provider will check your:  Height and weight. This may be used to calculate body mass index (BMI), which tells if you are at a healthy weight.  Heart rate and blood pressure.  Skin for abnormal spots. Counseling Your health care provider may ask you questions about your:  Alcohol, tobacco, and drug use.  Emotional well-being.  Home and relationship well-being.  Sexual activity.  Eating habits.  Work and work environment.  Method of birth control.  Menstrual cycle.  Pregnancy history. What immunizations do I need?  Influenza (flu) vaccine  This is recommended every year. Tetanus, diphtheria, and pertussis (Tdap) vaccine  You may need a Td booster every 10 years. Varicella (chickenpox) vaccine  You may need this if you have not been vaccinated. Zoster (shingles) vaccine  You may  need this after age 60. Measles, mumps, and rubella (MMR) vaccine  You may need at least one dose of MMR if you were born in 1957 or later. You may also need a second dose. Pneumococcal conjugate (PCV13) vaccine  You may need this if you have certain conditions and were not previously vaccinated. Pneumococcal polysaccharide (PPSV23) vaccine  You may need one or two doses if you smoke cigarettes or if you have certain conditions. Meningococcal conjugate (MenACWY) vaccine  You may need this if you have certain conditions. Hepatitis A vaccine  You may need this if you have certain conditions or if you travel or work in places where you may be exposed to hepatitis A. Hepatitis B vaccine  You may need this if you have certain conditions or if you travel or work in places where you may be exposed to hepatitis B. Haemophilus influenzae type b (Hib) vaccine  You may need this if you have certain conditions. Human papillomavirus (HPV) vaccine  If recommended by your health care provider, you may need three doses over 6 months. You may receive vaccines as individual doses or as more than one vaccine together in one shot (combination vaccines). Talk with your health care provider about the risks and benefits of combination vaccines. What tests do I need? Blood tests  Lipid and cholesterol levels. These may be checked every 5 years, or more frequently if you are over 50 years old.  Hepatitis C test.  Hepatitis B test. Screening  Lung cancer screening. You may have this screening every year starting at age 55 if you   have a 30-pack-year history of smoking and currently smoke or have quit within the past 15 years.  Colorectal cancer screening. All adults should have this screening starting at age 50 and continuing until age 75. Your health care provider may recommend screening at age 45 if you are at increased risk. You will have tests every 1-10 years, depending on your results and the type  of screening test.  Diabetes screening. This is done by checking your blood sugar (glucose) after you have not eaten for a while (fasting). You may have this done every 1-3 years.  Mammogram. This may be done every 1-2 years. Talk with your health care provider about when you should start having regular mammograms. This may depend on whether you have a family history of breast cancer.  BRCA-related cancer screening. This may be done if you have a family history of breast, ovarian, tubal, or peritoneal cancers.  Pelvic exam and Pap test. This may be done every 3 years starting at age 21. Starting at age 30, this may be done every 5 years if you have a Pap test in combination with an HPV test. Other tests  Sexually transmitted disease (STD) testing.  Bone density scan. This is done to screen for osteoporosis. You may have this scan if you are at high risk for osteoporosis. Follow these instructions at home: Eating and drinking  Eat a diet that includes fresh fruits and vegetables, whole grains, lean protein, and low-fat dairy.  Take vitamin and mineral supplements as recommended by your health care provider.  Do not drink alcohol if: ? Your health care provider tells you not to drink. ? You are pregnant, may be pregnant, or are planning to become pregnant.  If you drink alcohol: ? Limit how much you have to 0-1 drink a day. ? Be aware of how much alcohol is in your drink. In the [place] one drink equals one 12 oz bottle of beer (355 mL), one 5 oz glass of wine (148 mL), or one 1 oz glass of hard liquor (44 mL). Lifestyle  Take daily care of your teeth and gums.  Stay active. Exercise for at least 30 minutes on 5 or more days each week.  Do not use any products that contain nicotine or tobacco, such as cigarettes, e-cigarettes, and chewing tobacco. If you need help quitting, ask your health care provider.  If you are sexually active, practice safe sex. Use a condom or other form of  birth control (contraception) in order to prevent pregnancy and STIs (sexually transmitted infections).  If told by your health care provider, take low-dose aspirin daily starting at age 50. What's next?  Visit your health care provider once a year for a well check visit.  Ask your health care provider how often you should have your eyes and teeth checked.  Stay up to date on all vaccines. This information is not intended to replace advice given to you by your health care provider. Make sure you discuss any questions you have with your health care provider. Document Released: 04/05/2015 Document Revised: 11/18/2017 Document Reviewed: 11/18/2017 Elsevier Patient Education  2020 [employer]  

## 2018-09-28 NOTE — Assessment & Plan Note (Addendum)
Doing well on daily Valtrex 1000 mg, very rare outbreaks on this regimen but has daily headaches which is a known side effect. She's been taking this consistently for over one year. Trial a break for 1-3 months, if breakouts reoccur then start with 500 mg of Valtrex.  She will update. Refill sent to pharmacy.

## 2018-09-28 NOTE — Telephone Encounter (Signed)
Patient called back to state that she would like to get the shingles vaccine at her next appointment on the 22nd

## 2018-09-28 NOTE — Progress Notes (Signed)
Subjective:    Patient ID: Cassie Haynes, female    DOB: [birth year], 63 y.o.   MRN: [id number]  HPI  Ms. Cassie Haynes is a 63 year old female who presents today for complete physical.  Immunizations: -Tetanus: Completed in 2019 -Influenza: Due this season  -Shingles: Never completed   Diet: She endorses a healthy diet. She eats a lot of eggs, veggies burgers, fruit, sweet potatoes. Snacks on fruit, chips. Desserts daily. Drinks mostly water.  Exercise: She is walking   Eye exam: Completed in June 2020 Dental exam: Completes semi-annually  Colonoscopy: Completed in 2016 Pap Smear: Completed in 2019 Mammogram: Completed in October 2019 Hep C Screen: Negative in 2019  BP Readings from Last 3 Encounters:  09/28/18 120/78  08/10/17 116/70  05/06/17 116/82   Wt Readings from Last 3 Encounters:  09/28/18 127 lb 8 oz (57.8 kg)  08/10/17 137 lb 12 oz (62.5 kg)  05/06/17 140 lb (63.5 kg)     Review of Systems  Constitutional: Negative for unexpected weight change.  HENT: Negative for rhinorrhea.   Respiratory: Negative for cough and shortness of breath.   Cardiovascular: Negative for chest pain.  Gastrointestinal: Negative for constipation and diarrhea.  Genitourinary: Negative for difficulty urinating.  Musculoskeletal: Negative for arthralgias and myalgias.  Skin: Negative for rash.  Allergic/Immunologic: Negative for environmental allergies.  Neurological: Positive for headaches. Negative for dizziness and numbness.       Daily morning headaches, resolved with Tylenol or Ibuprofen   Psychiatric/Behavioral: The patient is not nervous/anxious.        Past Medical History:  Diagnosis Date  . Genital herpes      Social History   Socioeconomic History  . Marital status: Legally Separated    Spouse name: Not on file  . Number of children: Not on file  . Years of education: Not on file  . Highest education level: Not on file  Occupational History  . Not on file   Social Needs  . Financial resource strain: Not on file  . Food insecurity    Worry: Not on file    Inability: Not on file  . Transportation needs    Medical: Not on file    Non-medical: Not on file  Tobacco Use  . Smoking status: Former Smoker    Quit date: 04/23/1988    Years since quitting: 30.4  . Smokeless tobacco: Never Used  Substance and Sexual Activity  . Alcohol use: No  . Drug use: No  . Sexual activity: Yes  Lifestyle  . Physical activity    Days per week: Not on file    Minutes per session: Not on file  . Stress: Not on file  Relationships  . Social Herbalist on phone: Not on file    Gets together: Not on file    Attends religious service: Not on file    Active member of club or organization: Not on file    Attends meetings of clubs or organizations: Not on file    Relationship status: Not on file  . Intimate partner violence    Fear of current or ex partner: Not on file    Emotionally abused: Not on file    Physically abused: Not on file    Forced sexual activity: Not on file  Other Topics Concern  . Not on file  Social History Narrative   Married.   No children.   Works in [employer].   Enjoys traveling.  Past Surgical History:  Procedure Laterality Date  . BREAST EXCISIONAL BIOPSY Right    5 times, cysts removed  . BREAST SURGERY     five surgery    Family History  Problem Relation Age of Onset  . Migraines Mother   . Breast cancer Mother   . Diabetes Brother     Allergies  Allergen Reactions  . Aspirin Other (See Comments)    Upset Stomach  . Bactrim [Sulfamethoxazole-Trimethoprim]     No current outpatient medications on file prior to visit.   No current facility-administered medications on file prior to visit.     BP 120/78   Pulse 64   Temp 98 F (36.7 C) (Temporal)   Ht 5' 3.5" (1.613 m)   Wt 127 lb 8 oz (57.8 kg)   SpO2 98%   BMI 22.23 kg/m    Objective:   Physical Exam  Constitutional: She is  oriented to person, place, and time. She appears well-nourished.  HENT:  Mouth/Throat: No oropharyngeal exudate.  Eyes: Pupils are equal, round, and reactive to light. EOM are normal.  Neck: Neck supple. No thyromegaly present.  Cardiovascular: Normal rate and regular rhythm.  Respiratory: Effort normal and breath sounds normal.  GI: Soft. Bowel sounds are normal. There is no abdominal tenderness.  Musculoskeletal: Normal range of motion.  Neurological: She is alert and oriented to person, place, and time.  Skin: Skin is warm and dry.  Numerous nevi to body, mostly flat. 0.25 cm scaly nevus to mid thoracic back. Doesn't appear suspicious.  Psychiatric: She has a normal mood and affect.           Assessment & Plan:

## 2018-09-30 LAB — CYTOLOGY - PAP
Diagnosis: NEGATIVE
HPV: NOT DETECTED

## 2018-10-03 DIAGNOSIS — Z1239 Encounter for other screening for malignant neoplasm of breast: Secondary | ICD-10-CM

## 2018-10-12 ENCOUNTER — Ambulatory Visit: Payer: Managed Care, Other (non HMO) | Admitting: Primary Care

## 2018-10-17 ENCOUNTER — Other Ambulatory Visit: Payer: Self-pay

## 2018-10-17 ENCOUNTER — Ambulatory Visit (INDEPENDENT_AMBULATORY_CARE_PROVIDER_SITE_OTHER): Payer: Managed Care, Other (non HMO) | Admitting: Primary Care

## 2018-10-17 ENCOUNTER — Encounter: Payer: Self-pay | Admitting: Primary Care

## 2018-10-17 VITALS — BP 120/76 | HR 70 | Temp 97.8°F | Ht 63.5 in | Wt 130.2 lb

## 2018-10-17 DIAGNOSIS — D229 Melanocytic nevi, unspecified: Secondary | ICD-10-CM | POA: Insufficient documentation

## 2018-10-17 DIAGNOSIS — Z23 Encounter for immunization: Secondary | ICD-10-CM | POA: Diagnosis not present

## 2018-10-17 NOTE — Addendum Note (Signed)
Addended by: Jacqualin Combes on: 10/17/2018 08:07 AM   Modules accepted: Orders

## 2018-10-17 NOTE — Assessment & Plan Note (Addendum)
Bothersome for years, removed today given location and irritation.   Consent obtained.  Site cleansed with betadine. Lidocaine 2% with Epi used for analgesia. Shave biopsy took used for nevus removal. Silver nitrate sticks used for minimal bleeding. Dressing applied. Patient tolerated well. Discussed s/s of infection, home care instructions, return precautions.  Procedure assisted by Vallarie Mare CMA.

## 2018-10-17 NOTE — Patient Instructions (Signed)
We will send the mole off for pathology and will be in touch within a few days with the results.  Schedule a nurse visit to return in 2-6 months for your second Shingles vaccination.   It was a pleasure to see you today!

## 2018-10-17 NOTE — Progress Notes (Signed)
Subjective:    Patient ID: Cassie Haynes, female    DOB: 1955-11-02, 63 y.o.   MRN: [id number]  HPI  Cassie Haynes is a 63 year old female who presents today for nevus removal and her first Shingrix vaccination.  The nevus is located to the mid thoracic back around her posterior bra strap. The nevus has been present for years and bothersome due to location. Her clothing (bra strap) will often rub the site of the nevus creating irritation. She would like this removed. She cannot visualize the nevus so she doesn't know if the nevus has changed shape/color/etc.  Review of Systems  Constitutional: Negative for fever.  Skin:       Nevus        Past Medical History:  Diagnosis Date  . Genital herpes      Social History   Socioeconomic History  . Marital status: Legally Separated    Spouse name: Not on file  . Number of children: Not on file  . Years of education: Not on file  . Highest education level: Not on file  Occupational History  . Not on file  Social Needs  . Financial resource strain: Not on file  . Food insecurity    Worry: Not on file    Inability: Not on file  . Transportation needs    Medical: Not on file    Non-medical: Not on file  Tobacco Use  . Smoking status: Former Smoker    Quit date: 04/23/1988    Years since quitting: 30.5  . Smokeless tobacco: Never Used  Substance and Sexual Activity  . Alcohol use: No  . Drug use: No  . Sexual activity: Yes  Lifestyle  . Physical activity    Days per week: Not on file    Minutes per session: Not on file  . Stress: Not on file  Relationships  . Social Herbalist on phone: Not on file    Gets together: Not on file    Attends religious service: Not on file    Active member of club or organization: Not on file    Attends meetings of clubs or organizations: Not on file    Relationship status: Not on file  . Intimate partner violence    Fear of current or ex partner: Not on file    Emotionally  abused: Not on file    Physically abused: Not on file    Forced sexual activity: Not on file  Other Topics Concern  . Not on file  Social History Narrative   Married.   No children.   Works in [employer].   Enjoys traveling.     Past Surgical History:  Procedure Laterality Date  . BREAST EXCISIONAL BIOPSY Right    5 times, cysts removed  . BREAST SURGERY     five surgery    Family History  Problem Relation Age of Onset  . Migraines Mother   . Breast cancer Mother   . Diabetes Brother     Allergies  Allergen Reactions  . Aspirin Other (See Comments)    Upset Stomach  . Bactrim [Sulfamethoxazole-Trimethoprim]     Current Outpatient Medications on File Prior to Visit  Medication Sig Dispense Refill  . valACYclovir (VALTREX) 1000 MG tablet Take 0.5-1 tablets (500-1,000 mg total) by mouth daily. 90 tablet 0   No current facility-administered medications on file prior to visit.     There were no vitals taken for this visit.  Objective:   Physical Exam  Constitutional: She is oriented to person, place, and time. She appears well-nourished.  Respiratory: Effort normal.  Neurological: She is alert and oriented to person, place, and time.  Skin: Skin is warm and dry.     0.25 cm grey/light brown nevus to mid thoracic back. See picture.            Assessment & Plan:

## 2018-12-21 ENCOUNTER — Other Ambulatory Visit: Payer: Self-pay

## 2018-12-21 ENCOUNTER — Ambulatory Visit (INDEPENDENT_AMBULATORY_CARE_PROVIDER_SITE_OTHER): Payer: Managed Care, Other (non HMO)

## 2018-12-21 DIAGNOSIS — Z23 Encounter for immunization: Secondary | ICD-10-CM

## 2018-12-21 NOTE — Progress Notes (Signed)
Per orders of Alma Friendly, NP injection of Shingrix #2 and Flu vaccine given by Kris Mouton. Patient tolerated injection well.

## 2019-01-24 ENCOUNTER — Ambulatory Visit: Payer: Managed Care, Other (non HMO)

## 2019-02-27 DIAGNOSIS — B009 Herpesviral infection, unspecified: Secondary | ICD-10-CM

## 2019-02-28 MED ORDER — VALACYCLOVIR HCL 1 G PO TABS: 1000.0000 mg | ORAL_TABLET | Freq: Every day | ORAL | 1 refills | Status: DC

## 2019-02-28 NOTE — Telephone Encounter (Signed)
Last prescribed on 09/28/2018. Last appointment on 09/28/2018 (CPE). No future appointment

## 2019-03-02 ENCOUNTER — Ambulatory Visit
Admission: RE | Admit: 2019-03-02 | Discharge: 2019-03-02 | Disposition: A | Discharge status: Home / Self Care | Payer: Managed Care, Other (non HMO) | Source: Ambulatory Visit | Attending: Primary Care | Admitting: Primary Care

## 2019-03-02 ENCOUNTER — Other Ambulatory Visit: Payer: Self-pay

## 2019-03-02 DIAGNOSIS — Z1239 Encounter for other screening for malignant neoplasm of breast: Secondary | ICD-10-CM

## 2019-04-06 ENCOUNTER — Telehealth: Payer: Self-pay | Admitting: Primary Care

## 2019-04-06 NOTE — Telephone Encounter (Signed)
Do you have these papers for patient?

## 2019-04-06 NOTE — Telephone Encounter (Signed)
Patient called today to verify that we received paperwork  She stated she faxed this on Tuesday and it was Biometric paperwork  Patient would like a call back

## 2019-04-07 NOTE — Telephone Encounter (Signed)
I did complete this, it is in Chan's inbox ready for faxing.

## 2019-04-07 NOTE — Telephone Encounter (Signed)
Biometric form faxed to [phone].  Original completed form mailed to patient at requested.  Kathi notified of this via telephone.

## 2019-04-24 ENCOUNTER — Ambulatory Visit (INDEPENDENT_AMBULATORY_CARE_PROVIDER_SITE_OTHER): Payer: Managed Care, Other (non HMO) | Admitting: Primary Care

## 2019-04-24 ENCOUNTER — Other Ambulatory Visit: Payer: Self-pay

## 2019-04-24 ENCOUNTER — Encounter: Payer: Self-pay | Admitting: Primary Care

## 2019-04-24 VITALS — Wt 130.0 lb

## 2019-04-24 DIAGNOSIS — G8929 Other chronic pain: Secondary | ICD-10-CM

## 2019-04-24 DIAGNOSIS — N644 Mastodynia: Secondary | ICD-10-CM | POA: Diagnosis not present

## 2019-04-24 MED ORDER — GABAPENTIN 100 MG PO CAPS: ORAL_CAPSULE | ORAL | 0 refills | Status: DC

## 2019-04-24 NOTE — Patient Instructions (Signed)
Try the gabapentin medication for the pain. You may take 1 to 3 capsules at bedtime as needed for pain.  Please update me in 1-2 weeks as discussed.  Please notify me if your pain progresses, you develop chest pain, nausea, sweating with your symptoms.  It was a pleasure to see you today! Allie Bossier, NP-C

## 2019-04-24 NOTE — Addendum Note (Signed)
Addended by: Pleas Koch on: 04/24/2019 10:51 AM   Modules accepted: Level of Service

## 2019-04-24 NOTE — Assessment & Plan Note (Signed)
Chronic for 10 years, symptoms seem to originate to left breast with radiation to left shoulder.  Given negative breast ultrasounds and mammograms over the years I question whether this is MSK/neuropathic related. Could be originating to left shoulder.  Would recommend further imaging, perhaps plain films of left shoulder to start. Would also recommend ECG to rule out cardiac cause, although this doesn't seem representative. Could also do some lab work to evaluate for inflammatory disorders. Recommended a trial of gabapentin HS to see if this helps.  She prefers to start with gabapentin and update in 1-2 weeks. At that time we can consider further work up. Return precautions provided.

## 2019-04-24 NOTE — Progress Notes (Signed)
Subjective:    Patient ID: Cassie Haynes, female    DOB: 1955/05/28, 64 y.o.   MRN: [id number]  HPI  Virtual Visit via Video Note  I connected with Cassie Haynes on 04/24/19 at 10:20 AM EST by a video enabled telemedicine application and verified that I am speaking with the correct person using two identifiers.  Location: Patient: Work Provider: Office   I discussed the limitations of evaluation and management by telemedicine and the availability of in person appointments. The patient expressed understanding and agreed to proceed.  History of Present Illness:  Cassie Haynes is a 64 year old female who presents today with a chief complaint of chronic breast pain.  Chronic symptoms for 10 years and located to the left breast with radiation down to the bottom of the breast and up to the left shoulder. She describes her pain as an intermittent, sharp and dull at times, can feel the weight of the density of her breast tissue. Symptoms occur 1-2 times monthly on avearge, lasting several hours at a time. Over the last 6 months she's noticed increased frequency of symptoms which are now daily. She will notice tenderness and pain with laying on her back, mild exertion, rest.   She's taking Vitamin E and a multivitamin without improvement. She will take Ibuprofen with improvement but doesn't want to take daily NSAID's. She's undergone ultrasounds and mammogram imaging in the past without cause for symptoms.She underwent hospitalization in 2008, believes she underwent cardiac testing at the time, isn't sure what testing was completed.   She does have left posterior shoulder and left thoracic back pain which occurs daily. She denies decrease in ROM to her left shoulder. She denies chest pain.   Observations/Objective:  Alert and oriented. Appears well, not sickly. No distress. Speaking in complete sentences.  Assessment and Plan:  See problem based charting.  Follow Up  Instructions:  Try the gabapentin medication for the pain. You may take 1 to 3 capsules at bedtime as needed for pain.  Please update me in 1-2 weeks as discussed.  Please notify me if your pain progresses, you develop chest pain, nausea, sweating with your symptoms.  It was a pleasure to see you today! Allie Bossier, NP-C    I discussed the assessment and treatment plan with the patient. The patient was provided an opportunity to ask questions and all were answered. The patient agreed with the plan and demonstrated an understanding of the instructions.   The patient was advised to call back or seek an in-person evaluation if the symptoms worsen or if the condition fails to improve as anticipated.     Pleas Koch, NP     Review of Systems  Respiratory: Negative for shortness of breath.   Cardiovascular: Negative for chest pain.  Musculoskeletal: Positive for arthralgias.       Chronic left shoulder pain  Neurological:       Left breast pain with radiation to left shoulder       Past Medical History:  Diagnosis Date  . Genital herpes      Social History   Socioeconomic History  . Marital status: Legally Separated    Spouse name: Not on file  . Number of children: Not on file  . Years of education: Not on file  . Highest education level: Not on file  Occupational History  . Not on file  Tobacco Use  . Smoking status: Former Smoker    Quit date: 04/23/1988  Years since quitting: 31.0  . Smokeless tobacco: Never Used  Substance and Sexual Activity  . Alcohol use: No  . Drug use: No  . Sexual activity: Yes  Other Topics Concern  . Not on file  Social History Narrative   Married.   No children.   Works in [employer].   Enjoys traveling.    Social Determinants of Health   Financial Resource Strain:   . Difficulty of Paying Living Expenses: Not on file  Food Insecurity:   . Worried About Charity fundraiser in the Last Year: Not on file  . Ran Out  of Food in the Last Year: Not on file  Transportation Needs:   . Lack of Transportation (Medical): Not on file  . Lack of Transportation (Non-Medical): Not on file  Physical Activity:   . Days of Exercise per Week: Not on file  . Minutes of Exercise per Session: Not on file  Stress:   . Feeling of Stress : Not on file  Social Connections:   . Frequency of Communication with Friends and Family: Not on file  . Frequency of Social Gatherings with Friends and Family: Not on file  . Attends Religious Services: Not on file  . Active Member of Clubs or Organizations: Not on file  . Attends Archivist Meetings: Not on file  . Marital Status: Not on file  Intimate Partner Violence:   . Fear of Current or Ex-Partner: Not on file  . Emotionally Abused: Not on file  . Physically Abused: Not on file  . Sexually Abused: Not on file    Past Surgical History:  Procedure Laterality Date  . BREAST EXCISIONAL BIOPSY Right    5 times, cysts removed  . BREAST SURGERY     five surgery    Family History  Problem Relation Age of Onset  . Migraines Mother   . Breast cancer Mother   . Diabetes Brother     Allergies  Allergen Reactions  . Aspirin Other (See Comments)    Upset Stomach  . Bactrim [Sulfamethoxazole-Trimethoprim]     Current Outpatient Medications on File Prior to Visit  Medication Sig Dispense Refill  . valACYclovir (VALTREX) 1000 MG tablet Take 1 tablet (1,000 mg total) by mouth daily. 90 tablet 1   No current facility-administered medications on file prior to visit.    Wt 130 lb (59 kg)   BMI 22.67 kg/m    Objective:   Physical Exam  Constitutional: She is oriented to person, place, and time. She appears well-nourished.  Respiratory: Effort normal.  Musculoskeletal:     Left shoulder: Pain present. Normal range of motion.  Neurological: She is alert and oriented to person, place, and time.  Psychiatric: She has a normal mood and affect.            Assessment & Plan:

## 2019-09-07 DIAGNOSIS — B009 Herpesviral infection, unspecified: Secondary | ICD-10-CM

## 2019-09-07 MED ORDER — VALACYCLOVIR HCL 1 G PO TABS: 1000.0000 mg | ORAL_TABLET | Freq: Every day | ORAL | 1 refills | Status: DC

## 2019-09-07 NOTE — Telephone Encounter (Signed)
Last prescribed on 02/28/2019 #90 with 1 refill Last OV (acute ) with Allie Bossier on  04/24/2019 Future OV scheduled on 10/02/2019

## 2019-10-02 ENCOUNTER — Ambulatory Visit (INDEPENDENT_AMBULATORY_CARE_PROVIDER_SITE_OTHER): Payer: Managed Care, Other (non HMO) | Admitting: Primary Care

## 2019-10-02 ENCOUNTER — Other Ambulatory Visit (HOSPITAL_COMMUNITY)
Admission: RE | Admit: 2019-10-02 | Discharge: 2019-10-02 | Disposition: A | Discharge status: Home / Self Care | Payer: Managed Care, Other (non HMO) | Source: Ambulatory Visit | Attending: Primary Care | Admitting: Primary Care

## 2019-10-02 ENCOUNTER — Other Ambulatory Visit: Payer: Self-pay

## 2019-10-02 ENCOUNTER — Encounter: Payer: Self-pay | Admitting: Primary Care

## 2019-10-02 VITALS — BP 120/72 | HR 60 | Temp 96.0°F | Ht 63.5 in | Wt 128.5 lb

## 2019-10-02 DIAGNOSIS — Z114 Encounter for screening for human immunodeficiency virus [HIV]: Secondary | ICD-10-CM

## 2019-10-02 DIAGNOSIS — Z0000 Encounter for general adult medical examination without abnormal findings: Secondary | ICD-10-CM | POA: Diagnosis not present

## 2019-10-02 DIAGNOSIS — B009 Herpesviral infection, unspecified: Secondary | ICD-10-CM | POA: Diagnosis not present

## 2019-10-02 DIAGNOSIS — N644 Mastodynia: Secondary | ICD-10-CM | POA: Diagnosis not present

## 2019-10-02 DIAGNOSIS — G8929 Other chronic pain: Secondary | ICD-10-CM

## 2019-10-02 DIAGNOSIS — Z124 Encounter for screening for malignant neoplasm of cervix: Secondary | ICD-10-CM | POA: Insufficient documentation

## 2019-10-02 DIAGNOSIS — Z1231 Encounter for screening mammogram for malignant neoplasm of breast: Secondary | ICD-10-CM

## 2019-10-02 LAB — CBC
HCT: 38.2 % (ref 36.0–46.0)
Hemoglobin: 12.7 g/dL (ref 12.0–15.0)
MCHC: 33.3 g/dL (ref 30.0–36.0)
MCV: 96.3 fl (ref 78.0–100.0)
Platelets: 179 10*3/uL (ref 150.0–400.0)
RBC: 3.97 Mil/uL (ref 3.87–5.11)
RDW: 13.8 % (ref 11.5–15.5)
WBC: 3.8 10*3/uL — ABNORMAL LOW (ref 4.0–10.5)

## 2019-10-02 LAB — COMPREHENSIVE METABOLIC PANEL
ALT: 14 U/L (ref 0–35)
AST: 21 U/L (ref 0–37)
Albumin: 4.6 g/dL (ref 3.5–5.2)
Alkaline Phosphatase: 59 U/L (ref 39–117)
BUN: 16 mg/dL (ref 6–23)
CO2: 31 mEq/L (ref 19–32)
Calcium: 9.2 mg/dL (ref 8.4–10.5)
Chloride: 104 mEq/L (ref 96–112)
Creatinine, Ser: 0.88 mg/dL (ref 0.40–1.20)
GFR: 64.94 mL/min (ref >60.00)
Glucose, Bld: 101 mg/dL — ABNORMAL HIGH (ref 70–99)
Potassium: 4.3 mEq/L (ref 3.5–5.1)
Sodium: 141 mEq/L (ref 135–145)
Total Bilirubin: 0.4 mg/dL (ref 0.2–1.2)
Total Protein: 7.2 g/dL (ref 6.0–8.3)

## 2019-10-02 LAB — LIPID PANEL
Cholesterol: 196 mg/dL (ref 0–200)
HDL: 58.1 mg/dL (ref >39.00)
LDL Cholesterol: 122 mg/dL — ABNORMAL HIGH (ref 0–99)
NonHDL: 137.75
Total CHOL/HDL Ratio: 3
Triglycerides: 81 mg/dL (ref 0.0–149.0)
VLDL: 16.2 mg/dL (ref 0.0–40.0)

## 2019-10-02 NOTE — Progress Notes (Signed)
Subjective:    Patient ID: Cassie Haynes, female    DOB: 1955/04/27, 64 y.o.   MRN: [id number]  HPI  This visit occurred during the SARS-CoV-2 public health emergency.  Safety protocols were in place, including screening questions prior to the visit, additional usage of staff PPE, and extensive cleaning of exam room while observing appropriate contact time as indicated for disinfecting solutions.   Cassie Haynes is a 64 year old female who presents today for complete physical and pap smear.  Immunizations: -Tetanus: Completed in 2019 -Influenza: Completed last season  -Shingles: Completed series -Covid-19: Completed series  Diet: She endorses a healthy diet.  Exercise: Walking daily   Eye exam: Completed in 2021 Dental exam: Completes every 4 months  Pap Smear: Completed in 2020 Mammogram: Completed in December 2020 Colonoscopy: Completed in 2016, due in 2026 Hep C Screen: Negative  BP Readings from Last 3 Encounters:  10/02/19 120/72  10/17/18 120/76  09/28/18 120/78      Review of Systems  Constitutional: Negative for unexpected weight change.  HENT: Negative for rhinorrhea.   Respiratory: Negative for cough and shortness of breath.   Cardiovascular: Negative for chest pain.  Gastrointestinal: Negative for constipation and diarrhea.  Genitourinary: Negative for difficulty urinating.  Musculoskeletal: Positive for arthralgias.  Skin: Negative for rash.       Facial rash  Allergic/Immunologic: Negative for environmental allergies.  Neurological: Positive for headaches. Negative for dizziness and numbness.  Psychiatric/Behavioral: The patient is not nervous/anxious.        Past Medical History:  Diagnosis Date  . Genital herpes      Social History   Socioeconomic History  . Marital status: Legally Separated    Spouse name: Not on file  . Number of children: Not on file  . Years of education: Not on file  . Highest education level: Not on file   Occupational History  . Not on file  Tobacco Use  . Smoking status: Former Smoker    Quit date: 04/23/1988    Years since quitting: 31.4  . Smokeless tobacco: Never Used  Substance and Sexual Activity  . Alcohol use: No  . Drug use: No  . Sexual activity: Yes  Other Topics Concern  . Not on file  Social History Narrative   Married.   No children.   Works in [employer].   Enjoys traveling.    Social Determinants of Health   Financial Resource Strain:   . Difficulty of Paying Living Expenses:   Food Insecurity:   . Worried About Charity fundraiser in the Last Year:   . Arboriculturist in the Last Year:   Transportation Needs:   . Film/video editor (Medical):   Marland Kitchen Lack of Transportation (Non-Medical):   Physical Activity:   . Days of Exercise per Week:   . Minutes of Exercise per Session:   Stress:   . Feeling of Stress :   Social Connections:   . Frequency of Communication with Friends and Family:   . Frequency of Social Gatherings with Friends and Family:   . Attends Religious Services:   . Active Member of Clubs or Organizations:   . Attends Archivist Meetings:   Marland Kitchen Marital Status:   Intimate Partner Violence:   . Fear of Current or Ex-Partner:   . Emotionally Abused:   Marland Kitchen Physically Abused:   . Sexually Abused:     Past Surgical History:  Procedure Laterality Date  . BREAST EXCISIONAL  BIOPSY Right    5 times, cysts removed  . BREAST SURGERY     five surgery    Family History  Problem Relation Age of Onset  . Migraines Mother   . Breast cancer Mother   . Diabetes Brother     Allergies  Allergen Reactions  . Aspirin Other (See Comments)    Upset Stomach  . Bactrim [Sulfamethoxazole-Trimethoprim]     Current Outpatient Medications on File Prior to Visit  Medication Sig Dispense Refill  . gabapentin (NEURONTIN) 100 MG capsule Take 1 to 3 capsules at bedtime as needed for pain. 60 capsule 0  . valACYclovir (VALTREX) 1000 MG tablet  Take 1 tablet (1,000 mg total) by mouth daily. 90 tablet 1   No current facility-administered medications on file prior to visit.    BP 120/72   Pulse 60   Temp (!) 96 F (35.6 C) (Temporal)   Ht 5' 3.5" (1.613 m)   Wt 128 lb 8 oz (58.3 kg)   SpO2 98%   BMI 22.41 kg/m    Objective:   Physical Exam HENT:     Right Ear: Tympanic membrane and ear canal normal.     Left Ear: Tympanic membrane and ear canal normal.  Eyes:     Pupils: Pupils are equal, round, and reactive to light.  Cardiovascular:     Rate and Rhythm: Normal rate and regular rhythm.  Pulmonary:     Effort: Pulmonary effort is normal.     Breath sounds: Normal breath sounds.  Abdominal:     General: Bowel sounds are normal.     Palpations: Abdomen is soft.     Tenderness: There is no abdominal tenderness.  Musculoskeletal:        General: Normal range of motion.     Cervical back: Neck supple.  Skin:    General: Skin is warm and dry.     Comments: Three small red spots to right face under right bottom lip. No scaling or vesicles.   Neurological:     Mental Status: She is alert and oriented to person, place, and time.     Cranial Nerves: No cranial nerve deficit.     Deep Tendon Reflexes:     Reflex Scores:      Patellar reflexes are 2+ on the right side and 2+ on the left side.           Assessment & Plan:

## 2019-10-02 NOTE — Assessment & Plan Note (Signed)
Overall doing well on daily suppressive therapy, continue Valtrex daily.

## 2019-10-02 NOTE — Assessment & Plan Note (Signed)
Significantly improved, nearly resolved with gabapentin. No longer taking gabapentin daily, just using sparingly. Continue same.

## 2019-10-02 NOTE — Patient Instructions (Addendum)
Stop by the lab prior to leaving today. I will notify you of your results once received.   Start exercising. You should be getting 150 minutes of moderate intensity exercise weekly.  Continue to eat a healthy diet. Ensure you are consuming 64 ounces of water daily.  It was a pleasure to see you today!   Preventive Care 80-63 Years Old, Female Preventive care refers to visits with your health care provider and lifestyle choices that can promote health and wellness. This includes:  A yearly physical exam. This may also be called an annual well check.  Regular dental visits and eye exams.  Immunizations.  Screening for certain conditions.  Healthy lifestyle choices, such as eating a healthy diet, getting regular exercise, not using drugs or products that contain nicotine and tobacco, and limiting alcohol use. What can I expect for my preventive care visit? Physical exam Your health care provider will check your:  Height and weight. This may be used to calculate body mass index (BMI), which tells if you are at a healthy weight.  Heart rate and blood pressure.  Skin for abnormal spots. Counseling Your health care provider may ask you questions about your:  Alcohol, tobacco, and drug use.  Emotional well-being.  Home and relationship well-being.  Sexual activity.  Eating habits.  Work and work Statistician.  Method of birth control.  Menstrual cycle.  Pregnancy history. What immunizations do I need?  Influenza (flu) vaccine  This is recommended every year. Tetanus, diphtheria, and pertussis (Tdap) vaccine  You may need a Td booster every 10 years. Varicella (chickenpox) vaccine  You may need this if you have not been vaccinated. Zoster (shingles) vaccine  You may need this after age 69. Measles, mumps, and rubella (MMR) vaccine  You may need at least one dose of MMR if you were born in 1957 or later. You may also need a second dose. Pneumococcal conjugate  (PCV13) vaccine  You may need this if you have certain conditions and were not previously vaccinated. Pneumococcal polysaccharide (PPSV23) vaccine  You may need one or two doses if you smoke cigarettes or if you have certain conditions. Meningococcal conjugate (MenACWY) vaccine  You may need this if you have certain conditions. Hepatitis A vaccine  You may need this if you have certain conditions or if you travel or work in places where you may be exposed to hepatitis A. Hepatitis B vaccine  You may need this if you have certain conditions or if you travel or work in places where you may be exposed to hepatitis B. Haemophilus influenzae type b (Hib) vaccine  You may need this if you have certain conditions. Human papillomavirus (HPV) vaccine  If recommended by your health care provider, you may need three doses over 6 months. You may receive vaccines as individual doses or as more than one vaccine together in one shot (combination vaccines). Talk with your health care provider about the risks and benefits of combination vaccines. What tests do I need? Blood tests  Lipid and cholesterol levels. These may be checked every 5 years, or more frequently if you are over 21 years old.  Hepatitis C test.  Hepatitis B test. Screening  Lung cancer screening. You may have this screening every year starting at age 68 if you have a 30-pack-year history of smoking and currently smoke or have quit within the past 15 years.  Colorectal cancer screening. All adults should have this screening starting at age 69 and continuing until age  45. Your health care provider may recommend screening at age 48 if you are at increased risk. You will have tests every 1-10 years, depending on your results and the type of screening test.  Diabetes screening. This is done by checking your blood sugar (glucose) after you have not eaten for a while (fasting). You may have this done every 1-3 years.  Mammogram. This  may be done every 1-2 years. Talk with your health care provider about when you should start having regular mammograms. This may depend on whether you have a family history of breast cancer.  BRCA-related cancer screening. This may be done if you have a family history of breast, ovarian, tubal, or peritoneal cancers.  Pelvic exam and Pap test. This may be done every 3 years starting at age 34. Starting at age 82, this may be done every 5 years if you have a Pap test in combination with an HPV test. Other tests  Sexually transmitted disease (STD) testing.  Bone density scan. This is done to screen for osteoporosis. You may have this scan if you are at high risk for osteoporosis. Follow these instructions at home: Eating and drinking  Eat a diet that includes fresh fruits and vegetables, whole grains, lean protein, and low-fat dairy.  Take vitamin and mineral supplements as recommended by your health care provider.  Do not drink alcohol if: ? Your health care provider tells you not to drink. ? You are pregnant, may be pregnant, or are planning to become pregnant.  If you drink alcohol: ? Limit how much you have to 0-1 drink a day. ? Be aware of how much alcohol is in your drink. In the [place] one drink equals one 12 oz bottle of beer (355 mL), one 5 oz glass of wine (148 mL), or one 1 oz glass of hard liquor (44 mL). Lifestyle  Take daily care of your teeth and gums.  Stay active. Exercise for at least 30 minutes on 5 or more days each week.  Do not use any products that contain nicotine or tobacco, such as cigarettes, e-cigarettes, and chewing tobacco. If you need help quitting, ask your health care provider.  If you are sexually active, practice safe sex. Use a condom or other form of birth control (contraception) in order to prevent pregnancy and STIs (sexually transmitted infections).  If told by your health care provider, take low-dose aspirin daily starting at age 37. What's  next?  Visit your health care provider once a year for a well check visit.  Ask your health care provider how often you should have your eyes and teeth checked.  Stay up to date on all vaccines. This information is not intended to replace advice given to you by your health care provider. Make sure you discuss any questions you have with your health care provider. Document Revised: 11/18/2017 Document Reviewed: 11/18/2017 Elsevier Patient Education  2020 [employer].

## 2019-10-02 NOTE — Assessment & Plan Note (Signed)
Immunizations UTD.  Pap smear UTD, she prefers to repeat today. Colonoscopy UTD, due in 2026 per patient. Encouraged a healthy diet, regular exercise. Exam today as noted. Labs pending.

## 2019-10-03 LAB — CYTOLOGY - PAP
Comment: NEGATIVE
Diagnosis: NEGATIVE
High risk HPV: NEGATIVE

## 2019-10-03 LAB — HIV ANTIBODY (ROUTINE TESTING W REFLEX): HIV 1&2 Ab, 4th Generation: NONREACTIVE

## 2020-03-01 ENCOUNTER — Other Ambulatory Visit: Payer: Self-pay | Admitting: Primary Care

## 2020-03-01 DIAGNOSIS — Z1231 Encounter for screening mammogram for malignant neoplasm of breast: Secondary | ICD-10-CM

## 2020-03-04 ENCOUNTER — Ambulatory Visit: Payer: Managed Care, Other (non HMO)

## 2020-03-04 ENCOUNTER — Other Ambulatory Visit: Payer: Self-pay

## 2020-03-04 DIAGNOSIS — B009 Herpesviral infection, unspecified: Secondary | ICD-10-CM

## 2020-03-04 MED ORDER — VALACYCLOVIR HCL 1 G PO TABS: 1000.0000 mg | ORAL_TABLET | Freq: Every day | ORAL | 1 refills | Status: DC

## 2020-03-05 ENCOUNTER — Encounter: Payer: Self-pay | Admitting: Primary Care

## 2020-03-05 ENCOUNTER — Ambulatory Visit (INDEPENDENT_AMBULATORY_CARE_PROVIDER_SITE_OTHER): Payer: Managed Care, Other (non HMO) | Admitting: Primary Care

## 2020-03-05 ENCOUNTER — Other Ambulatory Visit: Payer: Self-pay

## 2020-03-05 VITALS — BP 118/62 | HR 68 | Temp 96.8°F | Ht 63.5 in | Wt 132.0 lb

## 2020-03-05 DIAGNOSIS — N6009 Solitary cyst of unspecified breast: Secondary | ICD-10-CM | POA: Insufficient documentation

## 2020-03-05 DIAGNOSIS — R21 Rash and other nonspecific skin eruption: Secondary | ICD-10-CM

## 2020-03-05 MED ORDER — DOXYCYCLINE HYCLATE 100 MG PO TABS: 100.0000 mg | ORAL_TABLET | Freq: Two times a day (BID) | ORAL | 0 refills | Status: DC

## 2020-03-05 NOTE — Progress Notes (Addendum)
Subjective:    Patient ID: Cassie Haynes, female    DOB: [birth year], 64 y.o.   MRN: [id number]  HPI  This visit occurred during the SARS-CoV-2 public health emergency.  Safety protocols were in place, including screening questions prior to the visit, additional usage of staff PPE, and extensive cleaning of exam room while observing appropriate contact time as indicated for disinfecting solutions.   Cassie Haynes is a 64 year old female who presents today with a chief complaint of rash.  Her rash is located under her nose and right side of the chin that began about six months ago. Since then her rash has progressed gradually. Her rash is itchy and red.  She's tried applying bacitracin, Vaseline, Dermacort, hydrocortisone without improvement. She is washing her face daily.   She has a history of this type of rash in the past, was initiated on tetracycline and had complete resolve for years.   No new lotions, detergents, soaps or shampoos. No new medicines, vitamins, supplements. No new pets. No recent outdoor exposure or poison ivy exposure. No bonfire or smoke exposure.  No recent motel or hotel stay or new beds.   No fevers/chills, oral lesions, new joint pains, tick bites, abdominal pain, nausea.    Review of Systems  Constitutional: Negative for fever.  Skin: Positive for rash.  Neurological: Negative for numbness.       Past Medical History:  Diagnosis Date  . Genital herpes      Social History   Socioeconomic History  . Marital status: Divorced    Spouse name: Not on file  . Number of children: Not on file  . Years of education: Not on file  . Highest education level: Not on file  Occupational History  . Not on file  Tobacco Use  . Smoking status: Former Smoker    Quit date: 04/23/1988    Years since quitting: 31.8  . Smokeless tobacco: Never Used  Substance and Sexual Activity  . Alcohol use: No  . Drug use: No  . Sexual activity: Yes  Other Topics Concern   . Not on file  Social History Narrative   Married.   No children.   Works in [employer].   Enjoys traveling.    Social Determinants of Health   Financial Resource Strain: Not on file  Food Insecurity: Not on file  Transportation Needs: Not on file  Physical Activity: Not on file  Stress: Not on file  Social Connections: Not on file  Intimate Partner Violence: Not on file    Past Surgical History:  Procedure Laterality Date  . BREAST EXCISIONAL BIOPSY Right    5 times, cysts removed  . BREAST SURGERY     five surgery    Family History  Problem Relation Age of Onset  . Migraines Mother   . Breast cancer Mother   . Diabetes Brother     Allergies  Allergen Reactions  . Aspirin Other (See Comments)    Upset Stomach  . Bactrim [Sulfamethoxazole-Trimethoprim]     Current Outpatient Medications on File Prior to Visit  Medication Sig Dispense Refill  . valACYclovir (VALTREX) 1000 MG tablet Take 1 tablet (1,000 mg total) by mouth daily. 90 tablet 1   No current facility-administered medications on file prior to visit.    BP 118/62   Pulse 68   Temp (!) 96.8 F (36 C) (Temporal)   Ht 5' 3.5" (1.613 m)   Wt 132 lb (59.9 kg)   SpO2 97%  BMI 23.02 kg/m    Objective:   Physical Exam Pulmonary:     Effort: Pulmonary effort is normal.  Skin:    General: Skin is warm and dry.     Findings: Rash present.     Comments: See pictures.  Neurological:     Mental Status: She is alert.               Assessment & Plan:

## 2020-03-05 NOTE — Patient Instructions (Signed)
Start Doxycycline antibiotic for the infection. Take 1 tablet by mouth twice daily for 7 days.  Please update me via My Chart in one week.  It was a pleasure to see you today!

## 2020-03-05 NOTE — Assessment & Plan Note (Signed)
Chronic for six months, no improvement.  Exam today without evidence for herpes zoster or cellulitis. Given no improvement with numerous agents, will trial a one week course of Doxycycline.  She will update in one week.

## 2020-03-08 DIAGNOSIS — R21 Rash and other nonspecific skin eruption: Secondary | ICD-10-CM

## 2020-03-09 MED ORDER — PREDNISONE 20 MG PO TABS: ORAL_TABLET | ORAL | 0 refills | Status: DC

## 2020-03-11 ENCOUNTER — Telehealth: Payer: Self-pay

## 2020-03-11 NOTE — Telephone Encounter (Signed)
Left message for patient and sent mychart message in regards to dermatology appointment. Scheduled for 03/19/20 at 10:30 am with Dr Rozann Lesches at [facility].  Address: [address], [place], [place] [address] Phone: [phone]

## 2020-03-12 NOTE — Telephone Encounter (Signed)
Patient reviewed mychart message per Epic notes

## 2020-04-03 DIAGNOSIS — L710 Perioral dermatitis: Secondary | ICD-10-CM | POA: Diagnosis not present

## 2020-04-03 DIAGNOSIS — D2239 Melanocytic nevi of other parts of face: Secondary | ICD-10-CM | POA: Diagnosis not present

## 2020-04-11 ENCOUNTER — Ambulatory Visit: Payer: Managed Care, Other (non HMO)

## 2020-04-24 ENCOUNTER — Other Ambulatory Visit: Payer: Self-pay

## 2020-04-24 ENCOUNTER — Ambulatory Visit
Admission: RE | Admit: 2020-04-24 | Discharge: 2020-04-24 | Disposition: A | Discharge status: Home / Self Care | Payer: BC Managed Care – PPO | Source: Ambulatory Visit | Attending: Primary Care | Admitting: Primary Care

## 2020-04-24 DIAGNOSIS — Z1231 Encounter for screening mammogram for malignant neoplasm of breast: Secondary | ICD-10-CM | POA: Diagnosis not present

## 2020-04-30 ENCOUNTER — Ambulatory Visit: Payer: Managed Care, Other (non HMO)

## 2020-09-05 DIAGNOSIS — B009 Herpesviral infection, unspecified: Secondary | ICD-10-CM

## 2020-09-05 MED ORDER — VALACYCLOVIR HCL 1 G PO TABS: 1000.0000 mg | ORAL_TABLET | Freq: Every day | ORAL | 0 refills | Status: DC

## 2020-10-03 ENCOUNTER — Encounter: Payer: Managed Care, Other (non HMO) | Admitting: Primary Care

## 2020-10-17 ENCOUNTER — Other Ambulatory Visit: Payer: Self-pay

## 2020-10-17 ENCOUNTER — Encounter: Payer: Self-pay | Admitting: Primary Care

## 2020-10-17 ENCOUNTER — Other Ambulatory Visit (HOSPITAL_COMMUNITY)
Admission: RE | Admit: 2020-10-17 | Discharge: 2020-10-17 | Disposition: A | Discharge status: Home / Self Care | Payer: BC Managed Care – PPO | Source: Ambulatory Visit | Attending: Primary Care | Admitting: Primary Care

## 2020-10-17 ENCOUNTER — Ambulatory Visit (INDEPENDENT_AMBULATORY_CARE_PROVIDER_SITE_OTHER): Payer: BC Managed Care – PPO | Admitting: Primary Care

## 2020-10-17 VITALS — BP 120/64 | HR 68 | Temp 97.6°F | Ht 64.0 in | Wt 127.0 lb

## 2020-10-17 DIAGNOSIS — Z124 Encounter for screening for malignant neoplasm of cervix: Secondary | ICD-10-CM | POA: Insufficient documentation

## 2020-10-17 DIAGNOSIS — Z0000 Encounter for general adult medical examination without abnormal findings: Secondary | ICD-10-CM | POA: Diagnosis not present

## 2020-10-17 DIAGNOSIS — B009 Herpesviral infection, unspecified: Secondary | ICD-10-CM

## 2020-10-17 DIAGNOSIS — G8929 Other chronic pain: Secondary | ICD-10-CM

## 2020-10-17 DIAGNOSIS — N644 Mastodynia: Secondary | ICD-10-CM | POA: Diagnosis not present

## 2020-10-17 LAB — LIPID PANEL
Cholesterol: 196 mg/dL (ref 0–200)
HDL: 54.1 mg/dL (ref >39.00)
LDL Cholesterol: 124 mg/dL — ABNORMAL HIGH (ref 0–99)
NonHDL: 141.43
Total CHOL/HDL Ratio: 4
Triglycerides: 89 mg/dL (ref 0.0–149.0)
VLDL: 17.8 mg/dL (ref 0.0–40.0)

## 2020-10-17 LAB — COMPREHENSIVE METABOLIC PANEL
ALT: 10 U/L (ref 0–35)
AST: 17 U/L (ref 0–37)
Albumin: 4.4 g/dL (ref 3.5–5.2)
Alkaline Phosphatase: 57 U/L (ref 39–117)
BUN: 16 mg/dL (ref 6–23)
CO2: 32 mEq/L (ref 19–32)
Calcium: 9.5 mg/dL (ref 8.4–10.5)
Chloride: 103 mEq/L (ref 96–112)
Creatinine, Ser: 0.92 mg/dL (ref 0.40–1.20)
GFR: 66.11 mL/min (ref >60.00)
Glucose, Bld: 87 mg/dL (ref 70–99)
Potassium: 4.4 mEq/L (ref 3.5–5.1)
Sodium: 141 mEq/L (ref 135–145)
Total Bilirubin: 0.6 mg/dL (ref 0.2–1.2)
Total Protein: 7.2 g/dL (ref 6.0–8.3)

## 2020-10-17 LAB — CBC
HCT: 37.5 % (ref 36.0–46.0)
Hemoglobin: 12.4 g/dL (ref 12.0–15.0)
MCHC: 32.9 g/dL (ref 30.0–36.0)
MCV: 96.4 fl (ref 78.0–100.0)
Platelets: 170 10*3/uL (ref 150.0–400.0)
RBC: 3.89 Mil/uL (ref 3.87–5.11)
RDW: 14.2 % (ref 11.5–15.5)
WBC: 3.5 10*3/uL — ABNORMAL LOW (ref 4.0–10.5)

## 2020-10-17 NOTE — Assessment & Plan Note (Signed)
Immunizations UTD. Pap smear UTD, she would like to have this done annually. Mammogram UTD. Colonoscopy UTD, due in 2026.  Commended her on a healthy lifestyle.   Exam today as noted. Labs pending.

## 2020-10-17 NOTE — Assessment & Plan Note (Signed)
Improved temporarily with gabapentin, now returned and is daily. Breast exam benign but with tenderness to left breast. Mammogram from February 2022 negative.  Checking chest xray to rule out chest wall abnormality given normal mammogram. She agrees.  Consider re-trial of gabapentin or new trial of duloxetine. Await results.

## 2020-10-17 NOTE — Assessment & Plan Note (Signed)
Doing well on daily suppressive therapy, continue Valtrex 1000 mg daily.

## 2020-10-17 NOTE — Progress Notes (Signed)
Subjective:    Patient ID: Cassie Haynes, female    DOB: [birth year], 65 y.o.   MRN: [id number]  HPI  Cassie Haynes is a very pleasant 65 y.o. female who presents today for complete physical and follow up of chronic conditions.  She would also like to discuss chronic left breast pain. Her last mammogram was in February 2022, BI-RADS category 1. History of left breast mass in 2017, determined to be benign after diagnostic imaging. Chronic for the last 64 years, intermittent, located to the entire left breast with radiation upward and downward. She mostly notices her pain with movement. She was managed on gabapentin for a brief period of time last year with relief until about 3-6 months ago. Her pain is daily. She's never undergone a chest xray. She denies numbness/tingling, skin texture changes, color changes.   Immunizations: -Tetanus: 2019 -Influenza: Due this season  -Covid-19: 2 vaccines -Shingles: Shingrix in 2020   Diet: Currie.  Exercise: Walking  Eye exam: Completes annually  Dental exam: Completes semi-annually   Pap Smear: Completed in July 2021 Mammogram: Completed in February 2022 Colonoscopy: Completed in 2016  BP Readings from Last 3 Encounters:  10/17/20 120/64  03/05/20 118/62  10/02/19 120/72      Review of Systems  Constitutional:  Negative for unexpected weight change.  HENT:  Negative for rhinorrhea.   Eyes:  Negative for visual disturbance.  Respiratory:  Negative for shortness of breath.   Cardiovascular:  Negative for chest pain.  Gastrointestinal:  Negative for constipation and diarrhea.  Genitourinary:  Negative for difficulty urinating.       Chronic left breast pain  Musculoskeletal:  Negative for arthralgias and myalgias.  Skin:  Negative for rash.  Allergic/Immunologic: Negative for environmental allergies.  Neurological:  Negative for dizziness and headaches.  Psychiatric/Behavioral:  The patient is not nervous/anxious.          Past Medical History:  Diagnosis Date   Genital herpes     Social History   Socioeconomic History   Marital status: Divorced    Spouse name: Not on file   Number of children: Not on file   Years of education: Not on file   Highest education level: Not on file  Occupational History   Not on file  Tobacco Use   Smoking status: Former    Types: Cigarettes    Quit date: 04/23/1988    Years since quitting: 32.5   Smokeless tobacco: Never  Substance and Sexual Activity   Alcohol use: No   Drug use: No   Sexual activity: Yes  Other Topics Concern   Not on file  Social History Narrative   Married.   No children.   Works in [employer].   Enjoys traveling.    Social Determinants of Health   Financial Resource Strain: Not on file  Food Insecurity: Not on file  Transportation Needs: Not on file  Physical Activity: Not on file  Stress: Not on file  Social Connections: Not on file  Intimate Partner Violence: Not on file    Past Surgical History:  Procedure Laterality Date   BREAST EXCISIONAL BIOPSY Right    5 times, cysts removed   BREAST SURGERY     five surgery    Family History  Problem Relation Age of Onset   Migraines Mother    Breast cancer Mother    Diabetes Brother     Allergies  Allergen Reactions   Aspirin Other (See Comments)    Upset  Stomach   Bactrim [Sulfamethoxazole-Trimethoprim]     Current Outpatient Medications on File Prior to Visit  Medication Sig Dispense Refill   valACYclovir (VALTREX) 1000 MG tablet Take 1 tablet (1,000 mg total) by mouth daily. 90 tablet 0   No current facility-administered medications on file prior to visit.    BP 120/64   Pulse 68   Temp 97.6 F (36.4 C) (Temporal)   Ht '5\' 4"'$  (1.626 m)   Wt 127 lb (57.6 kg)   SpO2 96%   BMI 21.80 kg/m  Objective:   Physical Exam HENT:     Right Ear: Tympanic membrane and ear canal normal.     Left Ear: Tympanic membrane and ear canal normal.     Nose: Nose  normal.  Eyes:     Conjunctiva/sclera: Conjunctivae normal.     Pupils: Pupils are equal, round, and reactive to light.  Neck:     Thyroid: No thyromegaly.  Cardiovascular:     Rate and Rhythm: Normal rate and regular rhythm.     Heart sounds: No murmur heard. Pulmonary:     Effort: Pulmonary effort is normal.     Breath sounds: Normal breath sounds. No rales.  Chest:       Comments: Dense breast tissue to bilateral breasts. No masses. No skin texture changes. Left breast with tenderness to upper portion and left lateral trunk mostly.  Abdominal:     General: Bowel sounds are normal.     Palpations: Abdomen is soft.     Tenderness: There is no abdominal tenderness.  Musculoskeletal:        General: Normal range of motion.     Cervical back: Neck supple.  Lymphadenopathy:     Cervical: No cervical adenopathy.  Skin:    General: Skin is warm and dry.     Findings: No rash.  Neurological:     Mental Status: She is alert and oriented to person, place, and time.     Cranial Nerves: No cranial nerve deficit.     Deep Tendon Reflexes: Reflexes are normal and symmetric.          Assessment & Plan:      This visit occurred during the SARS-CoV-2 public health emergency.  Safety protocols were in place, including screening questions prior to the visit, additional usage of staff PPE, and extensive cleaning of exam room while observing appropriate contact time as indicated for disinfecting solutions.

## 2020-10-17 NOTE — Patient Instructions (Addendum)
Stop by the lab prior to leaving today. I will notify you of your results once received.   Complete the xray at your convenience.   It was a pleasure to see you today!  Preventive Care 18-64 Years Old, Female Preventive care refers to lifestyle choices and visits with your health care provider that can promote health and wellness. This includes: A yearly physical exam. This is also called an annual wellness visit. Regular dental and eye exams. Immunizations. Screening for certain conditions. Healthy lifestyle choices, such as: Eating a healthy diet. Getting regular exercise. Not using drugs or products that contain nicotine and tobacco. Limiting alcohol use. What can I expect for my preventive care visit? Physical exam Your health care provider will check your: Height and weight. These may be used to calculate your BMI (body mass index). BMI is a measurement that tells if you are at a healthy weight. Heart rate and blood pressure. Body temperature. Skin for abnormal spots. Counseling Your health care provider may ask you questions about your: Past medical problems. Family's medical history. Alcohol, tobacco, and drug use. Emotional well-being. Home life and relationship well-being. Sexual activity. Diet, exercise, and sleep habits. Work and work Statistician. Access to firearms. Method of birth control. Menstrual cycle. Pregnancy history. What immunizations do I need?  Vaccines are usually given at various ages, according to a schedule. Your health care provider will recommend vaccines for you based on your age, medicalhistory, and lifestyle or other factors, such as travel or where you work. What tests do I need? Blood tests Lipid and cholesterol levels. These may be checked every 5 years, or more often if you are over 42 years old. Hepatitis C test. Hepatitis B test. Screening Lung cancer screening. You may have this screening every year starting at age 33 if you have  a 30-pack-year history of smoking and currently smoke or have quit within the past 15 years. Colorectal cancer screening. All adults should have this screening starting at age 49 and continuing until age 32. Your health care provider may recommend screening at age 78 if you are at increased risk. You will have tests every 1-10 years, depending on your results and the type of screening test. Diabetes screening. This is done by checking your blood sugar (glucose) after you have not eaten for a while (fasting). You may have this done every 1-3 years. Mammogram. This may be done every 1-2 years. Talk with your health care provider about when you should start having regular mammograms. This may depend on whether you have a family history of breast cancer. BRCA-related cancer screening. This may be done if you have a family history of breast, ovarian, tubal, or peritoneal cancers. Pelvic exam and Pap test. This may be done every 3 years starting at age 63. Starting at age 22, this may be done every 5 years if you have a Pap test in combination with an HPV test. Other tests STD (sexually transmitted disease) testing, if you are at risk. Bone density scan. This is done to screen for osteoporosis. You may have this scan if you are at high risk for osteoporosis. Talk with your health care provider about your test results, treatment options,and if necessary, the need for more tests. Follow these instructions at home: Eating and drinking  Eat a diet that includes fresh fruits and vegetables, whole grains, lean protein, and low-fat dairy products. Take vitamin and mineral supplements as recommended by your health care provider. Do not drink alcohol if: Your  health care provider tells you not to drink. You are pregnant, may be pregnant, or are planning to become pregnant. If you drink alcohol: Limit how much you have to 0-1 drink a day. Be aware of how much alcohol is in your drink. In the [place] one  drink equals one 12 oz bottle of beer (355 mL), one 5 oz glass of wine (148 mL), or one 1 oz glass of hard liquor (44 mL).  Lifestyle Take daily care of your teeth and gums. Brush your teeth every morning and night with fluoride toothpaste. Floss one time each day. Stay active. Exercise for at least 30 minutes 5 or more days each week. Do not use any products that contain nicotine or tobacco, such as cigarettes, e-cigarettes, and chewing tobacco. If you need help quitting, ask your health care provider. Do not use drugs. If you are sexually active, practice safe sex. Use a condom or other form of protection to prevent STIs (sexually transmitted infections). If you do not wish to become pregnant, use a form of birth control. If you plan to become pregnant, see your health care provider for a prepregnancy visit. If told by your health care provider, take low-dose aspirin daily starting at age 51. Find healthy ways to cope with stress, such as: Meditation, yoga, or listening to music. Journaling. Talking to a trusted person. Spending time with friends and family. Safety Always wear your seat belt while driving or riding in a vehicle. Do not drive: If you have been drinking alcohol. Do not ride with someone who has been drinking. When you are tired or distracted. While texting. Wear a helmet and other protective equipment during sports activities. If you have firearms in your house, make sure you follow all gun safety procedures. What's next? Visit your health care provider once a year for an annual wellness visit. Ask your health care provider how often you should have your eyes and teeth checked. Stay up to date on all vaccines. This information is not intended to replace advice given to you by your health care provider. Make sure you discuss any questions you have with your healthcare provider. Document Revised: 12/12/2019 Document Reviewed: 11/18/2017 Elsevier Patient Education  2022  [employer].

## 2020-10-18 ENCOUNTER — Ambulatory Visit
Admission: RE | Admit: 2020-10-18 | Discharge: 2020-10-18 | Disposition: A | Discharge status: Home / Self Care | Payer: BC Managed Care – PPO | Source: Ambulatory Visit | Attending: Primary Care | Admitting: Primary Care

## 2020-10-18 ENCOUNTER — Other Ambulatory Visit: Payer: Self-pay | Admitting: Primary Care

## 2020-10-18 DIAGNOSIS — D72819 Decreased white blood cell count, unspecified: Secondary | ICD-10-CM | POA: Diagnosis not present

## 2020-10-18 DIAGNOSIS — R0789 Other chest pain: Secondary | ICD-10-CM | POA: Diagnosis not present

## 2020-10-18 DIAGNOSIS — G8929 Other chronic pain: Secondary | ICD-10-CM

## 2020-10-18 DIAGNOSIS — N644 Mastodynia: Secondary | ICD-10-CM

## 2020-10-18 LAB — CYTOLOGY - PAP
Comment: NEGATIVE
Diagnosis: NEGATIVE
High risk HPV: NEGATIVE

## 2020-10-21 LAB — PATHOLOGIST SMEAR REVIEW

## 2020-10-23 DIAGNOSIS — G8929 Other chronic pain: Secondary | ICD-10-CM

## 2020-10-23 MED ORDER — GABAPENTIN 100 MG PO CAPS
ORAL_CAPSULE | ORAL | 0 refills | Status: AC
Start: 2020-10-23 — End: ?

## 2020-12-03 DIAGNOSIS — Z1231 Encounter for screening mammogram for malignant neoplasm of breast: Secondary | ICD-10-CM

## 2021-01-07 DIAGNOSIS — B009 Herpesviral infection, unspecified: Secondary | ICD-10-CM

## 2021-01-07 MED ORDER — VALACYCLOVIR HCL 1 G PO TABS: 1000.0000 mg | ORAL_TABLET | Freq: Every day | ORAL | 1 refills | Status: DC

## 2021-03-21 DIAGNOSIS — U071 COVID-19: Secondary | ICD-10-CM | POA: Diagnosis not present

## 2021-03-21 DIAGNOSIS — Z20822 Contact with and (suspected) exposure to covid-19: Secondary | ICD-10-CM | POA: Diagnosis not present

## 2021-04-21 DIAGNOSIS — M5441 Lumbago with sciatica, right side: Secondary | ICD-10-CM | POA: Diagnosis not present

## 2021-04-21 DIAGNOSIS — M5137 Other intervertebral disc degeneration, lumbosacral region: Secondary | ICD-10-CM | POA: Diagnosis not present

## 2021-04-21 DIAGNOSIS — M5136 Other intervertebral disc degeneration, lumbar region: Secondary | ICD-10-CM | POA: Diagnosis not present

## 2021-04-21 DIAGNOSIS — M9903 Segmental and somatic dysfunction of lumbar region: Secondary | ICD-10-CM | POA: Diagnosis not present

## 2021-04-22 DIAGNOSIS — M5136 Other intervertebral disc degeneration, lumbar region: Secondary | ICD-10-CM | POA: Diagnosis not present

## 2021-04-22 DIAGNOSIS — M5137 Other intervertebral disc degeneration, lumbosacral region: Secondary | ICD-10-CM | POA: Diagnosis not present

## 2021-04-22 DIAGNOSIS — M9903 Segmental and somatic dysfunction of lumbar region: Secondary | ICD-10-CM | POA: Diagnosis not present

## 2021-04-22 DIAGNOSIS — M5441 Lumbago with sciatica, right side: Secondary | ICD-10-CM | POA: Diagnosis not present

## 2021-04-24 DIAGNOSIS — M5441 Lumbago with sciatica, right side: Secondary | ICD-10-CM | POA: Diagnosis not present

## 2021-04-24 DIAGNOSIS — M9903 Segmental and somatic dysfunction of lumbar region: Secondary | ICD-10-CM | POA: Diagnosis not present

## 2021-04-24 DIAGNOSIS — M5137 Other intervertebral disc degeneration, lumbosacral region: Secondary | ICD-10-CM | POA: Diagnosis not present

## 2021-04-24 DIAGNOSIS — M5136 Other intervertebral disc degeneration, lumbar region: Secondary | ICD-10-CM | POA: Diagnosis not present

## 2021-04-25 ENCOUNTER — Ambulatory Visit
Admission: RE | Admit: 2021-04-25 | Discharge: 2021-04-25 | Disposition: A | Discharge status: Home / Self Care | Payer: BC Managed Care – PPO | Source: Ambulatory Visit | Attending: Primary Care | Admitting: Primary Care

## 2021-04-25 DIAGNOSIS — Z1231 Encounter for screening mammogram for malignant neoplasm of breast: Secondary | ICD-10-CM

## 2021-04-28 DIAGNOSIS — M5441 Lumbago with sciatica, right side: Secondary | ICD-10-CM | POA: Diagnosis not present

## 2021-04-28 DIAGNOSIS — M5137 Other intervertebral disc degeneration, lumbosacral region: Secondary | ICD-10-CM | POA: Diagnosis not present

## 2021-04-28 DIAGNOSIS — M9903 Segmental and somatic dysfunction of lumbar region: Secondary | ICD-10-CM | POA: Diagnosis not present

## 2021-04-28 DIAGNOSIS — M5136 Other intervertebral disc degeneration, lumbar region: Secondary | ICD-10-CM | POA: Diagnosis not present

## 2021-04-29 DIAGNOSIS — M9903 Segmental and somatic dysfunction of lumbar region: Secondary | ICD-10-CM | POA: Diagnosis not present

## 2021-04-29 DIAGNOSIS — M5136 Other intervertebral disc degeneration, lumbar region: Secondary | ICD-10-CM | POA: Diagnosis not present

## 2021-04-29 DIAGNOSIS — M5137 Other intervertebral disc degeneration, lumbosacral region: Secondary | ICD-10-CM | POA: Diagnosis not present

## 2021-04-29 DIAGNOSIS — M5441 Lumbago with sciatica, right side: Secondary | ICD-10-CM | POA: Diagnosis not present

## 2021-05-01 DIAGNOSIS — M5441 Lumbago with sciatica, right side: Secondary | ICD-10-CM | POA: Diagnosis not present

## 2021-05-01 DIAGNOSIS — M9903 Segmental and somatic dysfunction of lumbar region: Secondary | ICD-10-CM | POA: Diagnosis not present

## 2021-05-01 DIAGNOSIS — M5137 Other intervertebral disc degeneration, lumbosacral region: Secondary | ICD-10-CM | POA: Diagnosis not present

## 2021-05-01 DIAGNOSIS — M5136 Other intervertebral disc degeneration, lumbar region: Secondary | ICD-10-CM | POA: Diagnosis not present

## 2021-05-05 DIAGNOSIS — M5441 Lumbago with sciatica, right side: Secondary | ICD-10-CM | POA: Diagnosis not present

## 2021-05-05 DIAGNOSIS — M9903 Segmental and somatic dysfunction of lumbar region: Secondary | ICD-10-CM | POA: Diagnosis not present

## 2021-05-05 DIAGNOSIS — M5136 Other intervertebral disc degeneration, lumbar region: Secondary | ICD-10-CM | POA: Diagnosis not present

## 2021-05-05 DIAGNOSIS — M5137 Other intervertebral disc degeneration, lumbosacral region: Secondary | ICD-10-CM | POA: Diagnosis not present

## 2021-05-06 DIAGNOSIS — M5441 Lumbago with sciatica, right side: Secondary | ICD-10-CM | POA: Diagnosis not present

## 2021-05-06 DIAGNOSIS — M9903 Segmental and somatic dysfunction of lumbar region: Secondary | ICD-10-CM | POA: Diagnosis not present

## 2021-05-06 DIAGNOSIS — M5137 Other intervertebral disc degeneration, lumbosacral region: Secondary | ICD-10-CM | POA: Diagnosis not present

## 2021-05-06 DIAGNOSIS — M5136 Other intervertebral disc degeneration, lumbar region: Secondary | ICD-10-CM | POA: Diagnosis not present

## 2021-06-09 DIAGNOSIS — M5459 Other low back pain: Secondary | ICD-10-CM | POA: Diagnosis not present

## 2021-06-09 DIAGNOSIS — M5451 Vertebrogenic low back pain: Secondary | ICD-10-CM | POA: Diagnosis not present

## 2021-06-25 DIAGNOSIS — M5416 Radiculopathy, lumbar region: Secondary | ICD-10-CM | POA: Diagnosis not present

## 2021-06-30 DIAGNOSIS — M5451 Vertebrogenic low back pain: Secondary | ICD-10-CM | POA: Diagnosis not present

## 2021-07-02 ENCOUNTER — Other Ambulatory Visit: Payer: Self-pay | Admitting: Primary Care

## 2021-07-02 DIAGNOSIS — B009 Herpesviral infection, unspecified: Secondary | ICD-10-CM

## 2021-07-24 DIAGNOSIS — M5416 Radiculopathy, lumbar region: Secondary | ICD-10-CM | POA: Diagnosis not present

## 2021-09-28 ENCOUNTER — Other Ambulatory Visit: Payer: Self-pay | Admitting: Primary Care

## 2021-09-28 DIAGNOSIS — B009 Herpesviral infection, unspecified: Secondary | ICD-10-CM

## 2021-10-16 ENCOUNTER — Other Ambulatory Visit: Payer: Self-pay | Admitting: Primary Care

## 2021-10-16 DIAGNOSIS — B009 Herpesviral infection, unspecified: Secondary | ICD-10-CM

## 2021-10-21 ENCOUNTER — Encounter: Payer: BC Managed Care – PPO | Admitting: Primary Care

## 2021-11-06 ENCOUNTER — Encounter: Payer: BC Managed Care – PPO | Admitting: Primary Care

## 2022-10-30 IMAGING — MG MM DIGITAL SCREENING BILAT W/ TOMO AND CAD
6 of 10 series · 6 of 30 positions shown · non-contrast
Comparison: Previous exam(s).

CLINICAL DATA: Screening.

EXAM:
DIGITAL SCREENING BILATERAL MAMMOGRAM WITH TOMOSYNTHESIS AND CAD
TECHNIQUE: Bilateral screening digital craniocaudal and mediolateral oblique
mammograms were obtained. Bilateral screening digital breast
tomosynthesis was performed. The images were evaluated with
computer-aided detection.

[L CC synth-2D]
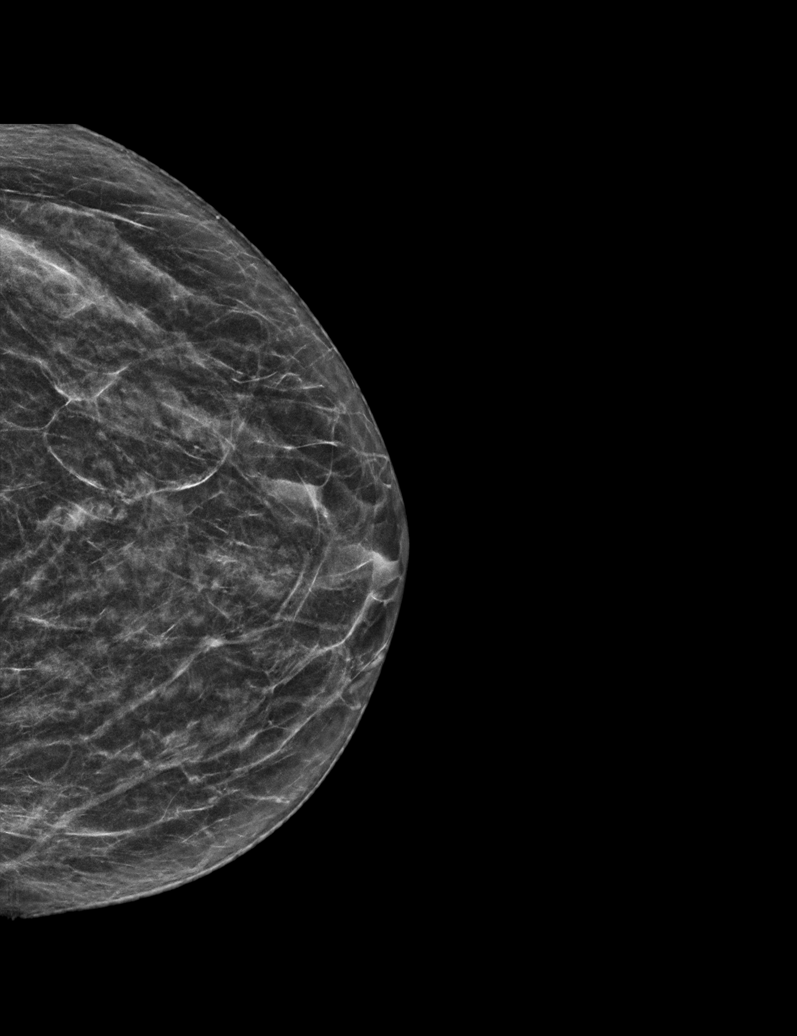

[R CC synth-2D (1 of 2)]
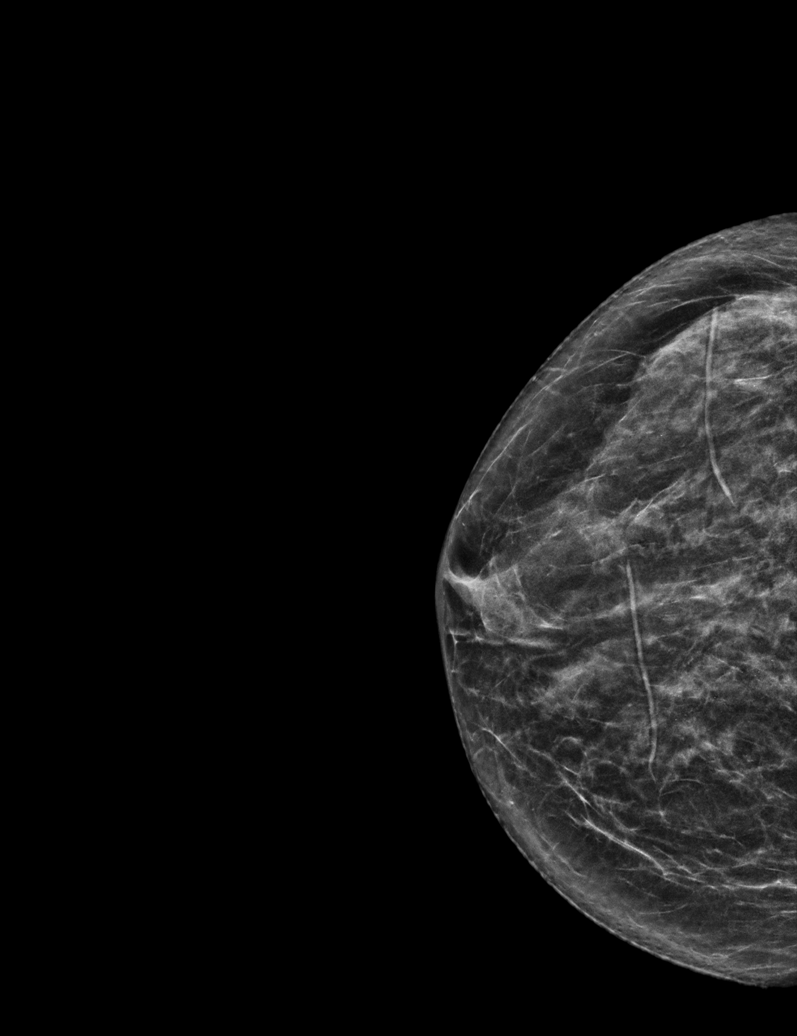

[R MLO synth-2D]
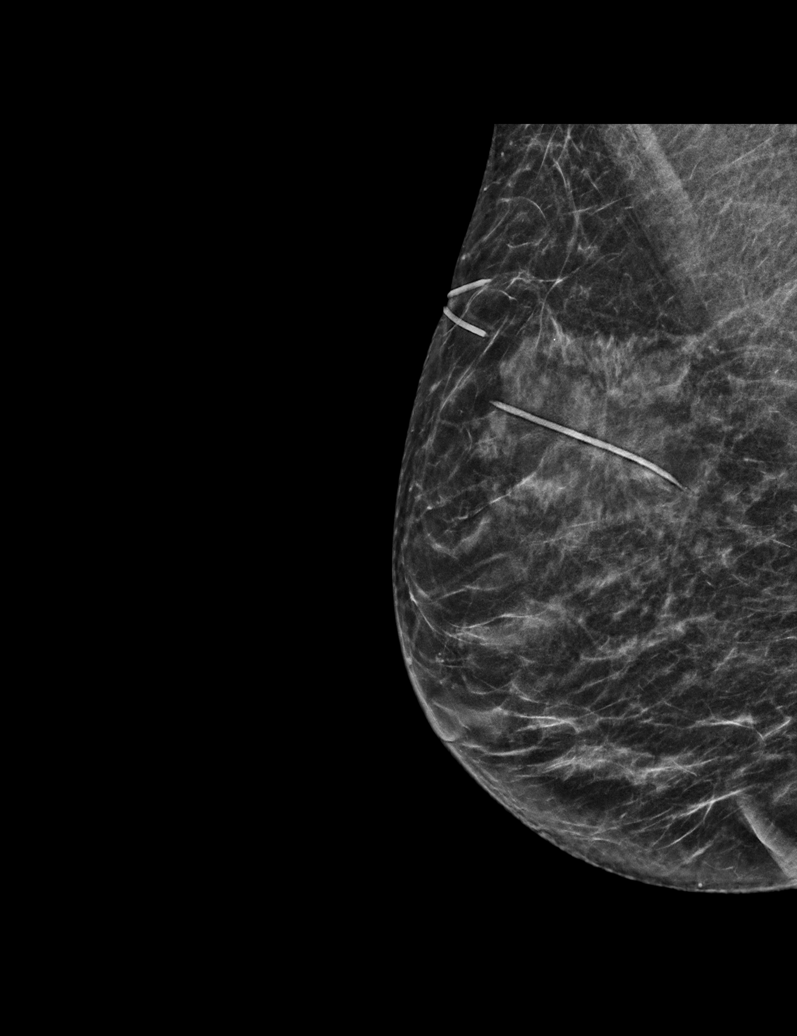

[R CC synth-2D (2 of 2)]
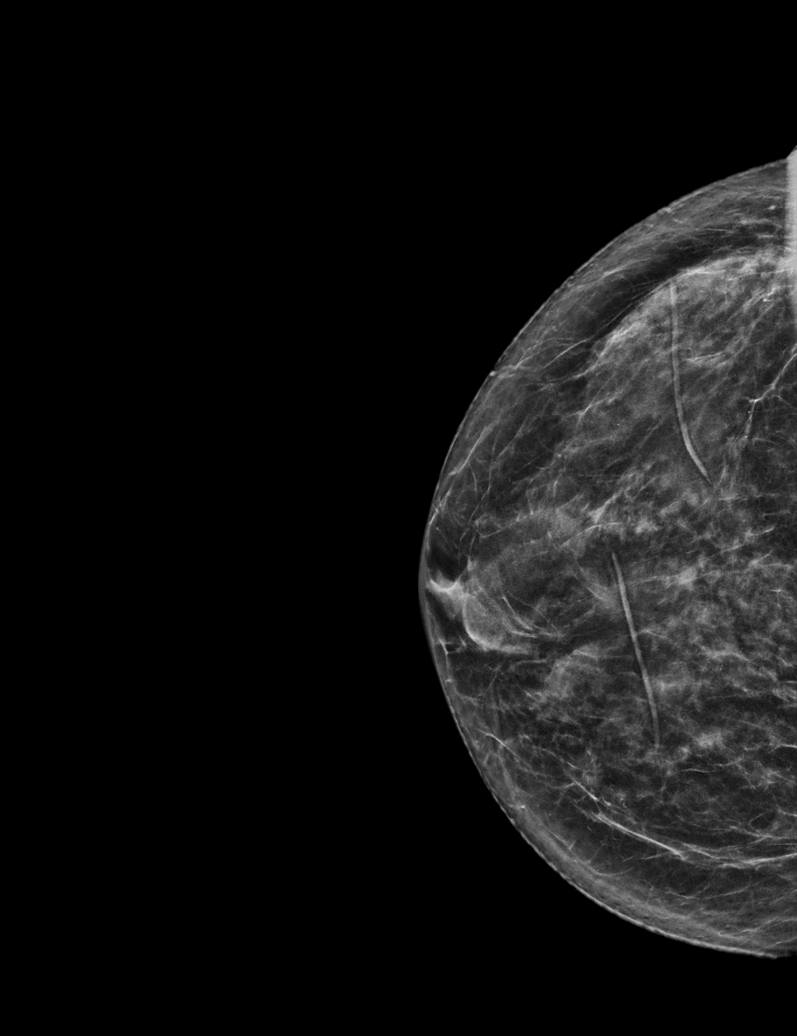

[L MLO synth-2D]
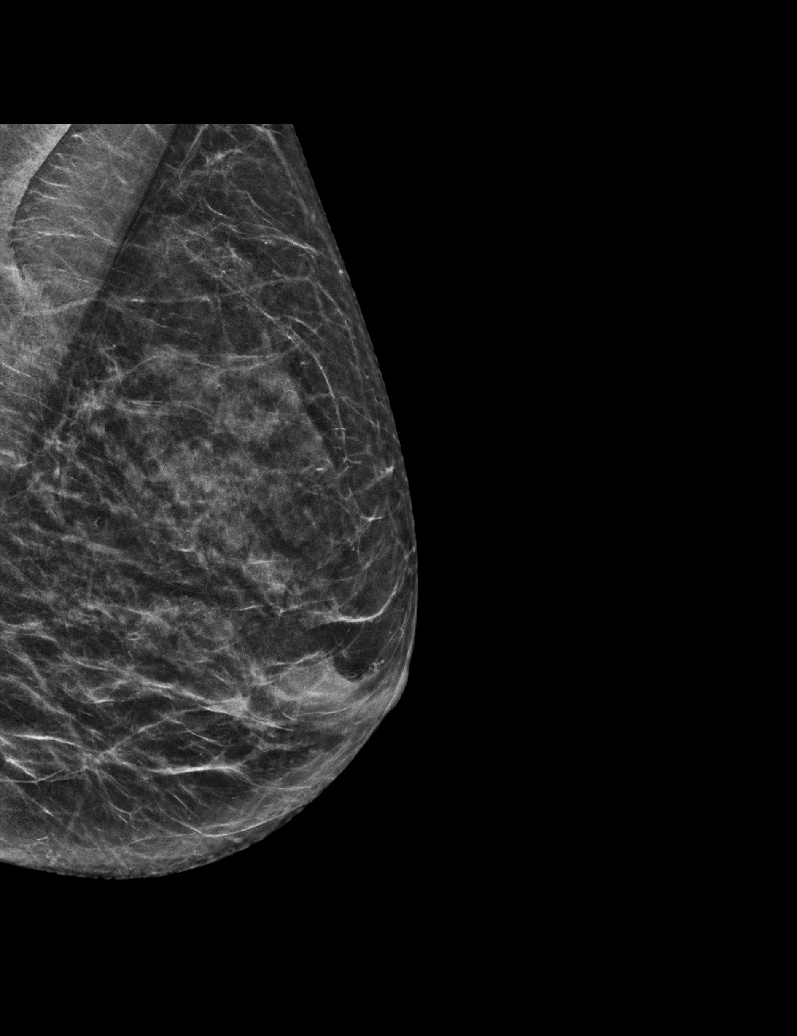

[L MLO tomo · tomo slice 27/52.0]
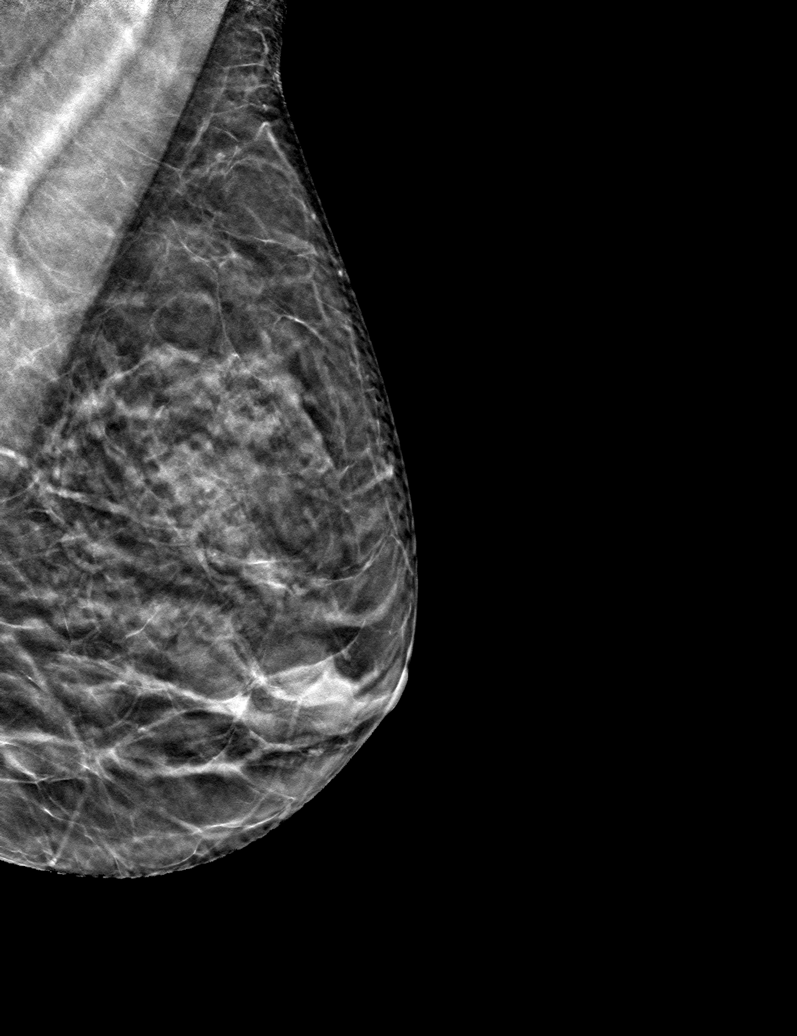

[6 of 30 positions shown; findings below may reference images not displayed]

ACR Breast Density Category c: The breast tissue is heterogeneously
dense, which may obscure small masses.
FINDINGS: There are no findings suspicious for malignancy.
IMPRESSION: No mammographic evidence of malignancy. A result letter of this
screening mammogram will be mailed directly to the patient.

RECOMMENDATION:
Screening mammogram in one year. (Code:[id number])

BI-RADS CATEGORY  1: Negative.
